# Patient Record
Sex: Female | Born: 1955 | Race: White | Hispanic: No | Marital: Married | Smoking: Never smoker
Health system: Southern US, Community
[De-identification: ages and names within clinical notes are randomized; demographics above are authoritative.]

## PROBLEM LIST (undated history)

## (undated) DIAGNOSIS — Z800 Family history of malignant neoplasm of digestive organs: Secondary | ICD-10-CM

## (undated) DIAGNOSIS — E785 Hyperlipidemia, unspecified: Secondary | ICD-10-CM

## (undated) DIAGNOSIS — I341 Nonrheumatic mitral (valve) prolapse: Secondary | ICD-10-CM

## (undated) DIAGNOSIS — I10 Essential (primary) hypertension: Secondary | ICD-10-CM

## (undated) DIAGNOSIS — K259 Gastric ulcer, unspecified as acute or chronic, without hemorrhage or perforation: Secondary | ICD-10-CM

## (undated) DIAGNOSIS — K219 Gastro-esophageal reflux disease without esophagitis: Secondary | ICD-10-CM

## (undated) DIAGNOSIS — M810 Age-related osteoporosis without current pathological fracture: Secondary | ICD-10-CM

## (undated) DIAGNOSIS — M8580 Other specified disorders of bone density and structure, unspecified site: Secondary | ICD-10-CM

## (undated) DIAGNOSIS — D649 Anemia, unspecified: Secondary | ICD-10-CM

## (undated) DIAGNOSIS — T7840XA Allergy, unspecified, initial encounter: Secondary | ICD-10-CM

## (undated) DIAGNOSIS — M1990 Unspecified osteoarthritis, unspecified site: Secondary | ICD-10-CM

## (undated) HISTORY — PX: KNEE ARTHROSCOPY: SHX127

## (undated) HISTORY — PX: UPPER GASTROINTESTINAL ENDOSCOPY: SHX188

## (undated) HISTORY — PX: TEMPOROMANDIBULAR JOINT SURGERY: SHX35

## (undated) HISTORY — DX: Essential (primary) hypertension: I10

## (undated) HISTORY — DX: Age-related osteoporosis without current pathological fracture: M81.0

## (undated) HISTORY — PX: CHOLECYSTECTOMY: SHX55

## (undated) HISTORY — PX: COLONOSCOPY: SHX174

## (undated) HISTORY — PX: TUBAL LIGATION: SHX77

## (undated) HISTORY — PX: GASTRIC BYPASS: SHX52

## (undated) HISTORY — DX: Other specified disorders of bone density and structure, unspecified site: M85.80

## (undated) HISTORY — DX: Nonrheumatic mitral (valve) prolapse: I34.1

## (undated) HISTORY — PX: SKIN CANCER EXCISION: SHX779

## (undated) HISTORY — DX: Gastric ulcer, unspecified as acute or chronic, without hemorrhage or perforation: K25.9

## (undated) HISTORY — DX: Gastro-esophageal reflux disease without esophagitis: K21.9

## (undated) HISTORY — PX: TONSILLECTOMY AND ADENOIDECTOMY: SUR1326

## (undated) HISTORY — PX: BREAST REDUCTION SURGERY: SHX8

## (undated) HISTORY — DX: Anemia, unspecified: D64.9

## (undated) HISTORY — DX: Family history of malignant neoplasm of digestive organs: Z80.0

## (undated) HISTORY — DX: Hyperlipidemia, unspecified: E78.5

## (undated) HISTORY — DX: Unspecified osteoarthritis, unspecified site: M19.90

## (undated) HISTORY — DX: Allergy, unspecified, initial encounter: T78.40XA

---

## 1984-05-18 HISTORY — PX: BREAST EXCISIONAL BIOPSY: SUR124

## 1999-05-19 HISTORY — PX: REDUCTION MAMMAPLASTY: SUR839

## 1999-06-02 ENCOUNTER — Encounter: Admission: RE | Admit: 1999-06-02 | Discharge: 1999-06-02 | Payer: Self-pay | Admitting: Family Medicine

## 1999-06-02 ENCOUNTER — Encounter: Payer: Self-pay | Admitting: Family Medicine

## 1999-06-12 ENCOUNTER — Encounter: Admission: RE | Admit: 1999-06-12 | Discharge: 1999-09-10 | Payer: Self-pay | Admitting: Family Medicine

## 1999-10-07 ENCOUNTER — Other Ambulatory Visit: Admission: RE | Admit: 1999-10-07 | Discharge: 1999-10-07 | Payer: Self-pay | Admitting: Specialist

## 1999-10-17 ENCOUNTER — Encounter: Payer: Self-pay | Admitting: Family Medicine

## 1999-10-17 ENCOUNTER — Encounter: Admission: RE | Admit: 1999-10-17 | Discharge: 1999-10-17 | Payer: Self-pay | Admitting: Family Medicine

## 2000-02-26 ENCOUNTER — Other Ambulatory Visit: Admission: RE | Admit: 2000-02-26 | Discharge: 2000-02-26 | Payer: Self-pay | Admitting: Plastic Surgery

## 2000-02-26 ENCOUNTER — Encounter (INDEPENDENT_AMBULATORY_CARE_PROVIDER_SITE_OTHER): Payer: Self-pay | Admitting: Specialist

## 2002-07-04 ENCOUNTER — Encounter: Payer: Self-pay | Admitting: Family Medicine

## 2002-07-04 ENCOUNTER — Encounter: Admission: RE | Admit: 2002-07-04 | Discharge: 2002-07-04 | Payer: Self-pay | Admitting: Family Medicine

## 2002-09-21 ENCOUNTER — Ambulatory Visit (HOSPITAL_COMMUNITY): Admission: RE | Admit: 2002-09-21 | Discharge: 2002-09-21 | Payer: Self-pay | Admitting: Gastroenterology

## 2003-09-11 ENCOUNTER — Encounter: Admission: RE | Admit: 2003-09-11 | Discharge: 2003-09-11 | Payer: Self-pay | Admitting: Family Medicine

## 2004-11-04 ENCOUNTER — Encounter: Admission: RE | Admit: 2004-11-04 | Discharge: 2004-11-04 | Payer: Self-pay | Admitting: Family Medicine

## 2005-11-19 ENCOUNTER — Encounter: Admission: RE | Admit: 2005-11-19 | Discharge: 2005-11-19 | Payer: Self-pay | Admitting: Family Medicine

## 2005-11-24 ENCOUNTER — Encounter: Admission: RE | Admit: 2005-11-24 | Discharge: 2005-11-24 | Payer: Self-pay | Admitting: Family Medicine

## 2006-06-07 ENCOUNTER — Ambulatory Visit (HOSPITAL_COMMUNITY): Admission: RE | Admit: 2006-06-07 | Discharge: 2006-06-07 | Payer: Self-pay | Admitting: *Deleted

## 2006-06-08 ENCOUNTER — Ambulatory Visit (HOSPITAL_COMMUNITY): Admission: RE | Admit: 2006-06-08 | Discharge: 2006-06-08 | Payer: Self-pay | Admitting: *Deleted

## 2006-06-11 ENCOUNTER — Encounter: Admission: RE | Admit: 2006-06-11 | Discharge: 2006-09-09 | Payer: Self-pay | Admitting: *Deleted

## 2006-12-09 ENCOUNTER — Ambulatory Visit (HOSPITAL_COMMUNITY): Admission: RE | Admit: 2006-12-09 | Discharge: 2006-12-09 | Payer: Self-pay | Admitting: *Deleted

## 2006-12-16 ENCOUNTER — Encounter: Admission: RE | Admit: 2006-12-16 | Discharge: 2006-12-16 | Payer: Self-pay | Admitting: Family Medicine

## 2007-03-29 ENCOUNTER — Inpatient Hospital Stay (HOSPITAL_COMMUNITY): Admission: RE | Admit: 2007-03-29 | Discharge: 2007-03-31 | Payer: Self-pay | Admitting: *Deleted

## 2007-03-30 ENCOUNTER — Ambulatory Visit: Payer: Self-pay | Admitting: Vascular Surgery

## 2007-03-30 ENCOUNTER — Encounter (INDEPENDENT_AMBULATORY_CARE_PROVIDER_SITE_OTHER): Payer: Self-pay | Admitting: *Deleted

## 2007-04-04 ENCOUNTER — Encounter: Admission: RE | Admit: 2007-04-04 | Discharge: 2007-07-03 | Payer: Self-pay | Admitting: *Deleted

## 2007-08-08 ENCOUNTER — Encounter: Admission: RE | Admit: 2007-08-08 | Discharge: 2007-08-08 | Payer: Self-pay | Admitting: *Deleted

## 2007-09-13 ENCOUNTER — Encounter: Admission: RE | Admit: 2007-09-13 | Discharge: 2007-09-13 | Payer: Self-pay | Admitting: Family Medicine

## 2007-09-20 ENCOUNTER — Ambulatory Visit (HOSPITAL_COMMUNITY): Admission: RE | Admit: 2007-09-20 | Discharge: 2007-09-20 | Payer: Self-pay | Admitting: Family Medicine

## 2007-10-31 ENCOUNTER — Encounter: Admission: RE | Admit: 2007-10-31 | Discharge: 2007-10-31 | Payer: Self-pay | Admitting: *Deleted

## 2008-01-04 ENCOUNTER — Encounter: Admission: RE | Admit: 2008-01-04 | Discharge: 2008-01-04 | Payer: Self-pay | Admitting: Family Medicine

## 2008-06-12 ENCOUNTER — Emergency Department (HOSPITAL_COMMUNITY): Admission: EM | Admit: 2008-06-12 | Discharge: 2008-06-12 | Payer: Self-pay | Admitting: Emergency Medicine

## 2008-07-23 ENCOUNTER — Encounter: Admission: RE | Admit: 2008-07-23 | Discharge: 2008-07-23 | Payer: Self-pay | Admitting: Gastroenterology

## 2008-08-14 ENCOUNTER — Ambulatory Visit (HOSPITAL_COMMUNITY): Admission: RE | Admit: 2008-08-14 | Discharge: 2008-08-14 | Payer: Self-pay | Admitting: *Deleted

## 2008-09-25 ENCOUNTER — Encounter: Admission: RE | Admit: 2008-09-25 | Discharge: 2008-09-25 | Payer: Self-pay | Admitting: Family Medicine

## 2009-04-02 ENCOUNTER — Encounter: Admission: RE | Admit: 2009-04-02 | Discharge: 2009-04-02 | Payer: Self-pay | Admitting: Internal Medicine

## 2009-10-24 ENCOUNTER — Encounter: Admission: RE | Admit: 2009-10-24 | Discharge: 2009-10-24 | Payer: Self-pay | Admitting: Family Medicine

## 2010-07-01 ENCOUNTER — Other Ambulatory Visit: Payer: Self-pay | Admitting: Internal Medicine

## 2010-07-01 DIAGNOSIS — D4410 Neoplasm of uncertain behavior of unspecified adrenal gland: Secondary | ICD-10-CM

## 2010-07-23 ENCOUNTER — Ambulatory Visit
Admission: RE | Admit: 2010-07-23 | Discharge: 2010-07-23 | Disposition: A | Discharge status: Home / Self Care | Payer: 59 | Source: Ambulatory Visit | Attending: Internal Medicine | Admitting: Internal Medicine

## 2010-07-23 DIAGNOSIS — D4410 Neoplasm of uncertain behavior of unspecified adrenal gland: Secondary | ICD-10-CM

## 2010-08-28 LAB — DIFFERENTIAL
Basophils Absolute: 0 10*3/uL (ref 0.0–0.1)
Basophils Relative: 0 % (ref 0–1)
Eosinophils Absolute: 0.1 10*3/uL (ref 0.0–0.7)
Eosinophils Relative: 1 % (ref 0–5)
Monocytes Relative: 6 % (ref 3–12)
Neutro Abs: 2.3 10*3/uL (ref 1.7–7.7)

## 2010-08-28 LAB — CBC
HCT: 35.4 % — ABNORMAL LOW (ref 36.0–46.0)
Hemoglobin: 11.9 g/dL — ABNORMAL LOW (ref 12.0–15.0)
Platelets: 164 10*3/uL (ref 150–400)

## 2010-09-01 LAB — CBC
HCT: 39.7 % (ref 36.0–46.0)
Hemoglobin: 13.3 g/dL (ref 12.0–15.0)
MCHC: 33.6 g/dL (ref 30.0–36.0)
MCV: 97.9 fL (ref 78.0–100.0)
WBC: 6.7 10*3/uL (ref 4.0–10.5)

## 2010-09-01 LAB — URINALYSIS, ROUTINE W REFLEX MICROSCOPIC
Bilirubin Urine: NEGATIVE
Glucose, UA: NEGATIVE mg/dL
Hgb urine dipstick: NEGATIVE
Nitrite: NEGATIVE
Protein, ur: NEGATIVE mg/dL
pH: 7.5 (ref 5.0–8.0)

## 2010-09-01 LAB — COMPREHENSIVE METABOLIC PANEL
ALT: 31 U/L (ref 0–35)
AST: 24 U/L (ref 0–37)
Alkaline Phosphatase: 60 U/L (ref 39–117)
GFR calc non Af Amer: >60 mL/min (ref >60)
Total Bilirubin: 0.9 mg/dL (ref 0.3–1.2)
Total Protein: 6 g/dL (ref 6.0–8.3)

## 2010-09-01 LAB — LIPASE, BLOOD: Lipase: 33 U/L (ref 11–59)

## 2010-09-01 LAB — DIFFERENTIAL
Basophils Relative: 0 % (ref 0–1)
Monocytes Relative: 5 % (ref 3–12)
Neutro Abs: 3.8 10*3/uL (ref 1.7–7.7)

## 2010-09-30 NOTE — Op Note (Signed)
Faith [name], Faith [name]               ACCOUNT NO.:  [id number]   MEDICAL RECORD NO.:  [id number]          PATIENT TYPE:  AMB   LOCATION:  DAY                          FACILITY:  [facility]   PHYSICIAN:  Alfonse Ras, MD   DATE OF BIRTH:  [birth year]   DATE OF PROCEDURE:  DATE OF DISCHARGE:                               OPERATIVE REPORT   PREOPERATIVE DIAGNOSIS:  Status post laparoscopic bypass with  intermittent crampy abdominal pain.   POSTOPERATIVE DIAGNOSIS:  Two small band adhesions, and no evidence of  internal hernia.   PROCEDURE:  Diagnostic laparoscopy.   ANESTHESIA:  General.   SURGEON:  Alfonse Ras, MD.   DESCRIPTION:  The patient was taken to the operating room and placed in  supine position.  After adequate general anesthesia was induced using  endotracheal tube, the abdomen was prepped and draped in normal sterile  fashion.  Using a supraumbilical small vertical incision, I dissected  down the fascia.  This was opened vertically.  No Vicryl pursestring  suture was placed around the defect.  Hassan trocar was placed, and  under direct vision two 5-mm trocars were placed.  The small bowel was  run from the terminal ileum up to the gastrojejunostomy.  There was no  evidence of internal hernia, mesenteric defect, or Peterson defect.  There were two small band adhesions which were lysed.  Adequate  hemostasis was ensured.  Pneumoperitoneum was released.  The trocar was  removed and closed with a O Vicryl pursestring suture.  Skin incisions  were closed with subcuticular 4-0 Monocryl.  Steri-Strips and sterile  dressings were applied.  The patient tolerated the procedure well and  went to PACU in good condition.      Alfonse Ras, MD  Electronically Signed     KRE/MEDQ  D:  08/14/2008  T:  08/14/2008  Job:  [id number]

## 2010-09-30 NOTE — Op Note (Signed)
Faith [name]               ACCOUNT NO.:  [id number]   MEDICAL RECORD NO.:  [id number]          PATIENT TYPE:  INP   LOCATION:  X001                         FACILITY:  [facility]   PHYSICIAN:  Alfonse Ras, MD   DATE OF BIRTH:  December 17, 1955   DATE OF PROCEDURE:  DATE OF DISCHARGE:                               OPERATIVE REPORT   PREOPERATIVE DIAGNOSIS:  Medically refractory morbid obesity.   POSTOPERATIVE DIAGNOSIS:  Medically refractory morbid obesity.   PROCEDURE:  Laparoscopic Roux-en-Y gastric bypass, antecolic,  antegastric, left facing Roux limb.   SURGEON:  Alfonse Ras, M.D.   ASSISTANT:  Thornton Park. Daphine Deutscher, M.D.   ANESTHESIA:  General.   DESCRIPTION:  The patient was taken to the operating room, placed in a  supine position.  After adequate general anesthesia was induced using  endotracheal tube, Foley catheter was placed and the abdomen was prepped  and draped in the normal sterile fashion.  Using an incision in the left  subcostal region, an Optiview trocar obtained peritoneal access under  direct vision.  Pneumoperitoneum was obtained.  Additional 12 mm trocars  were placed in the right upper quadrant and left infraumbilical region.  Additional 5 mm trocar was placed in the left abdomen under direct  vision.  The abdomen was inspected and found to have no abnormalities.  The ligament of Treitz was identified and counting down 40 cm distal,  the small bowel was transected using a white load GIA stapling device.  The distal end was tagged with a Penrose drain.  I then counted 100 cm  distal from the Penrose drain and performed a side-to-side  jejunojejunostomy from the biliary limb to the distal small bowel.  This  was accomplished with a 45 mm GIA stapling device after enterotomies  were created using a harmonic scalpel.  The defect was closed with  running 2-0 Vicryl suture and inspected.  Tisseel was placed over the  anastomosis.  Mesenteric defect was  closed with a running 2-0 silk  suture.   The patient was then put in steep head-up and all tubes were removed  from the mouth except the endotracheal tube.  The 5 mm trocar was placed  in the upper abdomen and Nathanson liver retractor was placed retract  the left lateral segment of the liver anteriorly.  The angle of His was  sharply and bluntly dissected.  An area on the lesser curve  approximately 4 cm from the EG junction was identified.  Using the  harmonic scalpel and blunt technique, the lesser sac was entered.  The  pouch was created with serial firings of the first gold load and then  blue load 60 mm GIA stapling device up to the angle of His.  The Maricela Curet  was put down through the EG junction to verify its presence.  The  Penrose drain was identified and the Roux limb was brought up along the  pouch.  The posterior layer was sewn with a running 2-0 Vicryl suture  along the staple line.  Tisseel was placed on the proximal part of the  pouch.  The remnant was oversewn with a running locking 2-0 silk suture.  A side-to-side gastrojejunostomy was performed after enterotomies were  created using a harmonic scalpel.  This was accomplished with a 45-mm  blue load stapling device.  The defect was closed with running 2-0  Vicryl sutures.  Additional stitches were placed in the anterior layer  where it appeared that there was a little gap along the suture line.  The endoscope was then inserted by Dr. Daphine Deutscher.  The Roux limb was  clamped and the anastomosis was insufflated with air and showed no  evidence of leak and patency of the anastomosis.  An anterior 2-0 Vicryl  suture was then placed through the serosa for the second layer.  Tisseel  was placed over the anastomosis.  Peterson's defect was closed with a  running 2-0 silk suture and __________  ties.  The upper abdomen was  irrigated.  Adequate hemostasis was ensured.  The patient was noted to  have a small hiatal hernia, which was  not repaired.  The  pneumoperitoneum was released.  Trocars were removed.  Skin was closed  with staples after being injected with Marcaine.  Sterile dressings were  applied.  The patient tolerated the procedure well and went to PACU in  good condition.      Alfonse Ras, MD  Electronically Signed     KRE/MEDQ  D:  03/29/2007  T:  03/29/2007  Job:  [id number]

## 2010-10-03 NOTE — Op Note (Signed)
   NAME:  Faith [name], Faith [name]                         ACCOUNT NO.:  [id number]   MEDICAL RECORD NO.:  [id number]                   PATIENT TYPE:  AMB   LOCATION:  ENDO                                 FACILITY:  [facility]   PHYSICIAN:  Danise Edge, M.D.                DATE OF BIRTH:  Jul 04, 1955   DATE OF PROCEDURE:  09/21/2002  DATE OF DISCHARGE:                                 OPERATIVE REPORT   PROCEDURE PERFORMED:  Screening colonoscopy.   ENDOSCOPIST:  Charolett Bumpers, M.D.   INDICATIONS FOR PROCEDURE:  The patient is [name] 55 year old female born  07/14/1955.  The patient's father was diagnosed with colon cancer at  age 44.   PREMEDICATION:  Versed 10 mg, Demerol 50 mg.   DESCRIPTION OF PROCEDURE:  After obtaining informed consent, the patient was  placed in the left lateral decubitus position.  I administered intravenous  Demerol and intravenous Versed to achieve conscious sedation for the  procedure.  The patient's blood pressure, oxygen saturations and cardiac  rhythm were monitored throughout the procedure and documented in the medical  record.   Anal inspection was normal.  Digital rectal exam was normal.  The pediatric  Olympus video colonoscope was introduced into the rectum and easily advanced  to the cecum.  Colonic preparation for the exam today was excellent.   Rectum:  Normal.   Sigmoid colon and descending colon:  Normal.   Splenic flexure:  Normal.   Transverse colon:  Normal.   Hepatic flexure:  Normal.   Ascending colon:  Normal.   Cecum and ileocecal valve:  Normal.    ASSESSMENT:  Normal screening proctocolonoscopy to the cecum.  No endoscopic  evidence for the presence of colorectal neoplasia.   RECOMMENDATIONS:  Repeat colonoscopy in five years.                                                 Danise Edge, M.D.    MJ/MEDQ  D:  09/21/2002  T:  09/21/2002  Job:  [id number]

## 2011-02-24 LAB — DIFFERENTIAL
Basophils Absolute: 0.1
Eosinophils Absolute: 0 — ABNORMAL LOW
Eosinophils Absolute: 0 — ABNORMAL LOW
Eosinophils Relative: 0
Eosinophils Relative: 0
Lymphocytes Relative: 15
Lymphocytes Relative: 37
Lymphs Abs: 0.8
Lymphs Abs: 1.9
Monocytes Absolute: 0.4
Monocytes Absolute: 0.5
Monocytes Relative: 3
Monocytes Relative: 5
Neutrophils Relative %: 57

## 2011-02-24 LAB — BASIC METABOLIC PANEL
CO2: 28
Chloride: 109
GFR calc Af Amer: >60
Glucose, Bld: 96
Sodium: 142

## 2011-02-24 LAB — CBC
HCT: 30.8 — ABNORMAL LOW
HCT: 33.3 — ABNORMAL LOW
HCT: 38.5
Hemoglobin: 10.8 — ABNORMAL LOW
Hemoglobin: 11.6 — ABNORMAL LOW
MCHC: 34.8
MCHC: 35
MCV: 93.6
MCV: 94.4
MCV: 94.6
Platelets: 231
RBC: 3.55 — ABNORMAL LOW
RBC: 4.08
RDW: 13.9
WBC: 14.2 — ABNORMAL HIGH
WBC: 5.2

## 2011-02-24 LAB — HEMOGLOBIN AND HEMATOCRIT, BLOOD: HCT: 35.5 — ABNORMAL LOW

## 2011-05-20 ENCOUNTER — Encounter: Payer: Self-pay | Admitting: Emergency Medicine

## 2011-05-20 ENCOUNTER — Emergency Department (HOSPITAL_COMMUNITY): Payer: Managed Care, Other (non HMO)

## 2011-05-20 ENCOUNTER — Emergency Department (HOSPITAL_COMMUNITY)
Admission: EM | Admit: 2011-05-20 | Discharge: 2011-05-21 | Disposition: A | Discharge status: Home / Self Care | Payer: Managed Care, Other (non HMO) | Attending: Emergency Medicine | Admitting: Emergency Medicine

## 2011-05-20 DIAGNOSIS — M25559 Pain in unspecified hip: Secondary | ICD-10-CM | POA: Insufficient documentation

## 2011-05-20 DIAGNOSIS — W010XXA Fall on same level from slipping, tripping and stumbling without subsequent striking against object, initial encounter: Secondary | ICD-10-CM | POA: Insufficient documentation

## 2011-05-20 DIAGNOSIS — S7001XA Contusion of right hip, initial encounter: Secondary | ICD-10-CM

## 2011-05-20 DIAGNOSIS — S7000XA Contusion of unspecified hip, initial encounter: Secondary | ICD-10-CM | POA: Insufficient documentation

## 2011-05-20 DIAGNOSIS — W19XXXA Unspecified fall, initial encounter: Secondary | ICD-10-CM

## 2011-05-20 NOTE — ED Notes (Signed)
PT. REPORTS FELL LAST CHRISTMAS EVE ,  SLIPPED AND FELL AT GARAGE FLOOR , PRESENTS WITH PROGRESSING RIGHT HIP PAIN ,  DENIES HEMATURIA OR VAGINAL DISCHARGE.  NO LOC.

## 2011-05-21 ENCOUNTER — Emergency Department (HOSPITAL_COMMUNITY): Payer: Managed Care, Other (non HMO)

## 2011-05-21 MED ORDER — ONDANSETRON 4 MG PO TBDP
8.0000 mg | ORAL_TABLET | Freq: Once | ORAL | Status: AC
  Administered 2011-05-21: 8 mg via ORAL
  Filled 2011-05-21: qty 2

## 2011-05-21 MED ORDER — HYDROCODONE-ACETAMINOPHEN 5-325 MG PO TABS: 2.0000 | ORAL_TABLET | ORAL | Status: AC | PRN

## 2011-05-21 MED ORDER — HYDROMORPHONE HCL PF 2 MG/ML IJ SOLN
2.0000 mg | Freq: Once | INTRAMUSCULAR | Status: AC
  Administered 2011-05-21: 2 mg via INTRAMUSCULAR
  Filled 2011-05-21: qty 1

## 2011-05-21 NOTE — ED Notes (Signed)
RETURNED FROM CT SCAN , NO PAIN AT THIS TIME , RESPIRATIONS UNLABORED .

## 2011-05-21 NOTE — ED Provider Notes (Signed)
Medical screening examination/treatment/procedure(s) were performed by non-physician practitioner and as supervising physician I was immediately available for consultation/collaboration.  Kinsey Cowsert, MD 05/21/11 0830 

## 2011-05-21 NOTE — ED Provider Notes (Signed)
History     CSN: [id number]  Arrival date & time 05/20/11  2135   First MD Initiated Contact with Patient 05/21/11 0017      Chief Complaint  Patient presents with  . Fall    (Consider location/radiation/quality/duration/timing/severity/associated sxs/prior treatment) Patient is a 56 y.o. female presenting with fall. The history is provided by the patient.  Fall The accident occurred more than 2 days ago. The fall occurred while walking. She fell from a height of 3 to 5 ft. She landed on a hard floor. The point of impact was the right hip. The pain is present in the right hip. The pain is at a severity of 8/10. The pain is severe. She was ambulatory at the scene. There was no entrapment after the fall. There was no drug use involved in the accident. There was no alcohol use involved in the accident. Pertinent negatives include no numbness and no tingling. The symptoms are aggravated by standing and use of the injured limb.  Patient reports she slipped on a wet floor and fell on her right hip 05/11/2011. Since that time she has had increasing pain to this area. States significant pain with ambulation.  History reviewed. No pertinent past medical history.  Past Surgical History  Procedure Date  . Gastric bypass   . Knee surgery     No family history on file.  History  Substance Use Topics  . Smoking status: Never Smoker   . Smokeless tobacco: Not on file  . Alcohol Use: No    OB History    Grav Para Term Preterm Abortions TAB SAB Ect Mult Living                  Review of Systems  Constitutional: Negative.   HENT: Negative.   Eyes: Negative.   Respiratory: Negative.   Cardiovascular: Negative.   Gastrointestinal: Negative.   Genitourinary: Negative.   Musculoskeletal: Negative.   Skin: Negative.   Neurological: Negative.  Negative for tingling and numbness.  Hematological: Negative.   Psychiatric/Behavioral: Negative.     Allergies  Review of patient's  allergies indicates no known allergies.  Home Medications   Current Outpatient Rx  Name Route Sig Dispense Refill  . CALCIUM PO Oral Take 1 tablet by mouth daily. Adult chewable calcium supplement     . FIBER PO CHEW Oral Chew 1 tablet by mouth daily. Adult chewable fiber supplement     . IBUPROFEN 200 MG PO TABS Oral Take 600 mg by mouth every 6 (six) hours as needed. For pain     . ADULT MULTIVITAMIN W/MINERALS CH Oral Take 3 tablets by mouth daily. Adult gummy multi-vitamins     . HYDROCODONE-ACETAMINOPHEN 5-325 MG PO TABS Oral Take 2 tablets by mouth every 4 (four) hours as needed for pain. Take 1 to 2 tabs PO q4h PRN pain  20 tablet 0    BP 162/87  Pulse 64  Temp(Src) 98.8 F (37.1 C) (Oral)  Resp 19  SpO2 100%  Physical Exam  Constitutional: She is oriented to person, place, and time. She appears well-developed and well-nourished.  HENT:  Head: Normocephalic and atraumatic.  Eyes: Conjunctivae are normal.  Neck: Neck supple.  Cardiovascular: Normal rate and regular rhythm.   Pulmonary/Chest: Effort normal and breath sounds normal.  Abdominal: Soft. Bowel sounds are normal.  Musculoskeletal: Normal range of motion.       Legs: Neurological: She is alert and oriented to person, place, and time.  Skin: Skin  is warm and dry. No erythema.  Psychiatric: She has a normal mood and affect.    ED Course  Procedures  right hip x-ray negative for acute fracture. Due to patient's level of pain, CT of the right hip.  Patient reports significant relief of pain with IM medication. CT findings discussed with patient and spouse. Will plan to discharge home with crutches to assist with ambulation, medication for pain, and encourage patient to follow up with Dr. Charlann Boxer on this Friday as scheduled. Patient agreeable with plan.    Labs Reviewed - No data to display Dg Hip Complete Right  05/20/2011  *RADIOLOGY REPORT*  Clinical Data: Right hip and buttock pain status post fall.  RIGHT HIP -  COMPLETE 2+ VIEW  Comparison: Pelvic CT 06/12/2008.  Findings: The mineralization and alignment are normal.  There is no evidence of acute fracture or dislocation.  There is no evidence of femoral head osteonecrosis.  The hip joint spaces are preserved. There are small pelvic phleboliths on the right which appear unchanged.  IMPRESSION: No acute osseous findings or significant arthropathic changes.  Original Report Authenticated By: Gerrianne Scale, M.D.   Ct Hip Right Wo Contrast  05/21/2011  *RADIOLOGY REPORT*  Clinical Data: Right hip pain after fall on 12/24  CT OF THE RIGHT HIP WITHOUT CONTRAST  Technique:  Multidetector CT imaging was performed according to the standard protocol. Multiplanar CT image reconstructions were also generated.  Comparison: Plain radiographs 05/20/2011  Findings: The right SI joint, visualized right pelvis, right acetabulum, and right hip appear intact.  No evidence of acute fracture or subluxation.  No focal bone lesion or bone destruction. Bone cortex and trabecular architecture are intact.  Mild hypertrophic degenerative changes in the hip joint.  No significant soft tissue hematoma or fluid collections.  IMPRESSION: Mild degenerative changes.  No acute fractures demonstrated in the right hip.  Original Report Authenticated By: Marlon Pel, M.D.     1. Contusion of right hip   2. Fall       MDM  Right hip contusion status post fall 10 days ago        Leanne Chang, NP 05/21/11 [phone]

## 2012-04-07 ENCOUNTER — Other Ambulatory Visit: Payer: Self-pay | Admitting: Family Medicine

## 2012-04-07 DIAGNOSIS — Z1231 Encounter for screening mammogram for malignant neoplasm of breast: Secondary | ICD-10-CM

## 2012-04-07 DIAGNOSIS — Z780 Asymptomatic menopausal state: Secondary | ICD-10-CM

## 2012-04-18 ENCOUNTER — Ambulatory Visit: Payer: Managed Care, Other (non HMO)

## 2012-04-18 ENCOUNTER — Ambulatory Visit: Payer: Managed Care, Other (non HMO) | Attending: Orthopaedic Surgery | Admitting: Physical Therapy

## 2012-04-18 DIAGNOSIS — M25569 Pain in unspecified knee: Secondary | ICD-10-CM | POA: Insufficient documentation

## 2012-04-18 DIAGNOSIS — IMO0001 Reserved for inherently not codable concepts without codable children: Secondary | ICD-10-CM | POA: Insufficient documentation

## 2012-04-18 DIAGNOSIS — M6281 Muscle weakness (generalized): Secondary | ICD-10-CM | POA: Insufficient documentation

## 2012-04-18 DIAGNOSIS — M25669 Stiffness of unspecified knee, not elsewhere classified: Secondary | ICD-10-CM | POA: Insufficient documentation

## 2012-04-21 ENCOUNTER — Encounter: Payer: 59 | Admitting: Physical Therapy

## 2012-04-26 ENCOUNTER — Ambulatory Visit: Payer: Managed Care, Other (non HMO) | Admitting: Physical Therapy

## 2012-05-03 ENCOUNTER — Ambulatory Visit: Payer: Managed Care, Other (non HMO) | Admitting: Physical Therapy

## 2012-05-05 ENCOUNTER — Encounter: Payer: 59 | Admitting: Physical Therapy

## 2012-05-20 ENCOUNTER — Ambulatory Visit
Admission: RE | Admit: 2012-05-20 | Discharge: 2012-05-20 | Disposition: A | Discharge status: Home / Self Care | Payer: Managed Care, Other (non HMO) | Source: Ambulatory Visit | Attending: Family Medicine | Admitting: Family Medicine

## 2012-05-20 DIAGNOSIS — Z1231 Encounter for screening mammogram for malignant neoplasm of breast: Secondary | ICD-10-CM

## 2012-05-20 DIAGNOSIS — Z780 Asymptomatic menopausal state: Secondary | ICD-10-CM

## 2012-06-01 ENCOUNTER — Ambulatory Visit (INDEPENDENT_AMBULATORY_CARE_PROVIDER_SITE_OTHER): Payer: Self-pay | Admitting: Surgery

## 2012-06-02 ENCOUNTER — Telehealth (INDEPENDENT_AMBULATORY_CARE_PROVIDER_SITE_OTHER): Payer: Self-pay

## 2012-06-02 NOTE — Telephone Encounter (Signed)
Spoke with pt in regards to MM canceling his office tomorrow.  Since she is a new bari, I offered his first available appt for that - Fri, Jan 31st @330p .  She accepted the appt.

## 2012-06-03 ENCOUNTER — Ambulatory Visit (INDEPENDENT_AMBULATORY_CARE_PROVIDER_SITE_OTHER): Payer: Self-pay | Admitting: Surgery

## 2012-06-17 ENCOUNTER — Encounter (INDEPENDENT_AMBULATORY_CARE_PROVIDER_SITE_OTHER): Payer: Self-pay | Admitting: Surgery

## 2012-06-17 ENCOUNTER — Ambulatory Visit (INDEPENDENT_AMBULATORY_CARE_PROVIDER_SITE_OTHER): Payer: Commercial Indemnity | Admitting: Surgery

## 2012-06-17 VITALS — BP 121/77 | HR 76 | Temp 97.8°F | Resp 18 | Ht 63.0 in | Wt 167.4 lb

## 2012-06-17 DIAGNOSIS — Z9884 Bariatric surgery status: Secondary | ICD-10-CM | POA: Insufficient documentation

## 2012-06-17 DIAGNOSIS — Z9049 Acquired absence of other specified parts of digestive tract: Secondary | ICD-10-CM

## 2012-06-17 NOTE — Progress Notes (Signed)
Faith [name] 57 y.o.  Body mass index is 29.65 kg/(m^2).  There is no problem list on file for this patient.   No Known Allergies  Past Surgical History  Procedure Date  . Gastric bypass   . Knee surgery   . Cholecystectomy    No primary provider on file. No diagnosis found.  Faith [name] had roux Y gastric bypass in 2008 by Dr. Colin Benton and a laparoscopy for 2 adhesions but no internal hernia in 07/2008.  Over the last month she's been having some intermittent right-sided abdominal pain about the level of the umbilicus but it is not associated with nausea vomiting. She has had her gallbladder removed previously. This is a new pain. I want to go ahead and get a CT scan to look for any evidence of a possible partial internal herniation or ventral hernia. I do not palpate anything in the intra-abdominal wall at indicates a ventral hernia is present.  Plan: CT abdomen and pelvis with followup discussion and evaluation. Matt B. Daphine Deutscher, MD, [facility] [phone] beeper [phone]  06/17/2012 4:58 PM

## 2012-06-21 ENCOUNTER — Ambulatory Visit
Admission: RE | Admit: 2012-06-21 | Discharge: 2012-06-21 | Disposition: A | Discharge status: Home / Self Care | Payer: Managed Care, Other (non HMO) | Source: Ambulatory Visit | Attending: Surgery | Admitting: Surgery

## 2012-06-21 DIAGNOSIS — Z9884 Bariatric surgery status: Secondary | ICD-10-CM

## 2012-06-21 MED ORDER — IOHEXOL 300 MG/ML  SOLN
100.0000 mL | Freq: Once | INTRAMUSCULAR | Status: AC | PRN
  Administered 2012-06-21: 100 mL via INTRAVENOUS

## 2012-06-24 ENCOUNTER — Telehealth (INDEPENDENT_AMBULATORY_CARE_PROVIDER_SITE_OTHER): Payer: Self-pay

## 2012-06-24 NOTE — Telephone Encounter (Signed)
The patient called for her CT results.  She is available until 2:30 at [phone].

## 2012-06-24 NOTE — Telephone Encounter (Signed)
Pt called for CT results; gave her the result as negative for any findings.  She would like to know what is Dr. Ermalene Searing next step to find out the source of her pain.  Please advise.  (She is not scheduled for a follow-up appt with Dr. Daphine Deutscher to discuss.)

## 2012-06-24 NOTE — Telephone Encounter (Signed)
LMOM; returned pt's call regarding her CT results.  I did not see in her chart that I could leave results on her VM, so I asked that she return call the office.  I also called the triage office to let them know that it is OK to give this pt her results over the phone.

## 2012-08-16 ENCOUNTER — Telehealth (INDEPENDENT_AMBULATORY_CARE_PROVIDER_SITE_OTHER): Payer: Self-pay

## 2012-08-16 NOTE — Telephone Encounter (Signed)
LMOM letting pt know that I have scheduled her to see MM to discuss her CT finding for Thurs May 8 @ 845a

## 2012-09-22 ENCOUNTER — Ambulatory Visit (INDEPENDENT_AMBULATORY_CARE_PROVIDER_SITE_OTHER): Payer: Commercial Indemnity | Admitting: Surgery

## 2012-12-05 ENCOUNTER — Other Ambulatory Visit: Payer: Self-pay | Admitting: Family Medicine

## 2012-12-05 DIAGNOSIS — Z139 Encounter for screening, unspecified: Secondary | ICD-10-CM

## 2012-12-12 ENCOUNTER — Ambulatory Visit
Admission: RE | Admit: 2012-12-12 | Discharge: 2012-12-12 | Disposition: A | Discharge status: Home / Self Care | Payer: Managed Care, Other (non HMO) | Source: Ambulatory Visit | Attending: Family Medicine | Admitting: Family Medicine

## 2012-12-12 DIAGNOSIS — Z139 Encounter for screening, unspecified: Secondary | ICD-10-CM

## 2013-07-11 ENCOUNTER — Other Ambulatory Visit: Payer: Self-pay

## 2013-07-11 DIAGNOSIS — Z1231 Encounter for screening mammogram for malignant neoplasm of breast: Secondary | ICD-10-CM

## 2013-07-25 ENCOUNTER — Ambulatory Visit
Admission: RE | Admit: 2013-07-25 | Discharge: 2013-07-25 | Disposition: A | Discharge status: Home / Self Care | Payer: BC Managed Care – PPO | Source: Ambulatory Visit

## 2013-07-25 DIAGNOSIS — Z1231 Encounter for screening mammogram for malignant neoplasm of breast: Secondary | ICD-10-CM

## 2014-07-05 ENCOUNTER — Other Ambulatory Visit: Payer: Self-pay | Admitting: Family Medicine

## 2014-07-05 DIAGNOSIS — Z1231 Encounter for screening mammogram for malignant neoplasm of breast: Secondary | ICD-10-CM

## 2014-07-05 DIAGNOSIS — M810 Age-related osteoporosis without current pathological fracture: Secondary | ICD-10-CM

## 2014-07-30 ENCOUNTER — Ambulatory Visit: Payer: Self-pay

## 2014-07-30 ENCOUNTER — Other Ambulatory Visit: Payer: Self-pay

## 2014-08-02 ENCOUNTER — Ambulatory Visit
Admission: RE | Admit: 2014-08-02 | Discharge: 2014-08-02 | Disposition: A | Discharge status: Home / Self Care | Payer: Commercial Indemnity | Source: Ambulatory Visit | Attending: Family Medicine | Admitting: Family Medicine

## 2014-08-02 DIAGNOSIS — M810 Age-related osteoporosis without current pathological fracture: Secondary | ICD-10-CM

## 2014-08-02 DIAGNOSIS — Z1231 Encounter for screening mammogram for malignant neoplasm of breast: Secondary | ICD-10-CM

## 2014-08-22 ENCOUNTER — Ambulatory Visit (INDEPENDENT_AMBULATORY_CARE_PROVIDER_SITE_OTHER): Payer: Managed Care, Other (non HMO) | Admitting: Neurology

## 2014-08-22 ENCOUNTER — Encounter: Payer: Self-pay | Admitting: Neurology

## 2014-08-22 VITALS — BP 112/78 | HR 61 | Resp 16 | Ht 63.0 in | Wt 164.0 lb

## 2014-08-22 DIAGNOSIS — I159 Secondary hypertension, unspecified: Secondary | ICD-10-CM | POA: Diagnosis not present

## 2014-08-22 DIAGNOSIS — R292 Abnormal reflex: Secondary | ICD-10-CM | POA: Insufficient documentation

## 2014-08-22 DIAGNOSIS — R413 Other amnesia: Secondary | ICD-10-CM | POA: Diagnosis not present

## 2014-08-22 NOTE — Progress Notes (Signed)
NEUROLOGY CONSULTATION NOTE  Faith [name] MRN: [id number] DOB: 02-16-1956  Referring provider: Bing Matter, PA-C Primary care provider: Bing Matter, PA-C  Reason for consult:  Memory loss  Thank you for your kind referral of [facility] for consultation of the above symptoms. Although her history is well known to you, please allow me to reiterate it for the purpose of our medical record. Records and images were personally reviewed where available.  HISTORY OF PRESENT ILLNESS: This is a pleasant 59 year old right-handed woman with history of diet-controlled hypertension, vertigo, presenting for evaluation of worsening short-term memory. She started noticing symptoms for the past few years, but this has been getting worse where she frequently misplaces things. She forgets her phone and has had to go home several times to get it. She forgets names, small details that has affected her work a little. She has trouble focusing on paperwork. She occasionally forgets a word she would normally know. Her husband and sons have told her that she repeats herself. She has misplaced bills and missed bill payments and has had to call credit card companies. She has burned food in the oven forgetting about it. She has noticed more difficulties multitasking. She denies getting lost driving, but has found herself going somewhere she did not mean to go, she can easily re-orient herself. She denies any difficulties with ADLs. She denies any head injuries, rare alcohol intake. She is adopted and does not know her family history.  She denies any headaches, dizziness, diplopia, dysarthria, dysphagia, back pain, focal numbness/tingling/weakness, bowel/bladder dysfunction. No tremor or anosmia. She denies any staring/unresponsive episodes. She has had occasional neck pain occurring around once a month. She has had a few bouts of positional vertigo.   Laboratory Data: 07/03/14: CBC, CMP normal. TSH 2.50  PAST  MEDICAL HISTORY: Past Medical History  Diagnosis Date  . Hypertension   . Arthritis   . Osteoporosis     PAST SURGICAL HISTORY: Past Surgical History  Procedure Laterality Date  . Gastric bypass    . Knee arthroscopy      right  . Cholecystectomy    . Breast reduction surgery    . Tonsillectomy and adenoidectomy    . Tubal ligation    . Temporomandibular joint surgery      MEDICATIONS: Current Outpatient Prescriptions on File Prior to Visit  Medication Sig Dispense Refill  . CALCIUM PO Take 1 tablet by mouth daily. Adult chewable calcium supplement     . ibandronate (BONIVA) 150 MG tablet Take 150 mg by mouth every 30 (thirty) days. Take in the morning with a full glass of water, on an empty stomach, and do not take anything else by mouth or lie down for the next 30 min.    Marland Kitchen ibuprofen (ADVIL,MOTRIN) 200 MG tablet Take 600 mg by mouth every 6 (six) hours as needed. For pain     . Multiple Vitamin (MULITIVITAMIN WITH MINERALS) TABS Take 3 tablets by mouth daily. Adult gummy multi-vitamins      No current facility-administered medications on file prior to visit.    ALLERGIES: No Known Allergies  FAMILY HISTORY: Family History  Problem Relation Age of Onset  . Adopted: Yes    SOCIAL HISTORY: History   Social History  . Marital Status: Married    Spouse Name: N/A  . Number of Children: 2  . Years of Education: N/A   Occupational History  . Mental Health Therapist    Social History Main Topics  .  Smoking status: Never Smoker   . Smokeless tobacco: Not on file  . Alcohol Use: 0.0 oz/week    0 Standard drinks or equivalent per week     Comment: rare  . Drug Use: No  . Sexual Activity: Not on file   Other Topics Concern  . Not on file   Social History Narrative    REVIEW OF SYSTEMS: Constitutional: No fevers, chills, or sweats, no generalized fatigue, change in appetite Eyes: No visual changes, double vision, eye pain Ear, nose and throat: No hearing  loss, ear pain, nasal congestion, sore throat Cardiovascular: No chest pain, palpitations Respiratory:  No shortness of breath at rest or with exertion, wheezes GastrointestinaI: No nausea, vomiting, diarrhea, abdominal pain, fecal incontinence Genitourinary:  No dysuria, urinary retention or frequency Musculoskeletal:  + neck pain,no back pain Integumentary: No rash, pruritus, skin lesions Neurological: as above Psychiatric: No depression, insomnia, anxiety Endocrine: No palpitations, fatigue, diaphoresis, mood swings, change in appetite, change in weight, increased thirst Hematologic/Lymphatic:  No anemia, purpura, petechiae. Allergic/Immunologic: no itchy/runny eyes, nasal congestion, recent allergic reactions, rashes  PHYSICAL EXAM: Filed Vitals:   08/22/14 1050  BP: 112/78  Pulse: 61  Resp: 16   General: No acute distress Head:  Normocephalic/atraumatic Eyes: Fundoscopic exam shows bilateral sharp discs, no vessel changes, exudates, or hemorrhages Neck: supple, no paraspinal tenderness, full range of motion Back: No paraspinal tenderness Heart: regular rate and rhythm Lungs: Clear to auscultation bilaterally. Vascular: No carotid bruits. Skin/Extremities: No rash, no edema Neurological Exam: Mental status: alert and oriented to person, place, and time, no dysarthria or aphasia, Fund of knowledge is appropriate.  Recent and remote memory are intact.  Attention and concentration are normal.    Able to name objects and repeat phrases.  Montreal Cognitive Assessment  08/22/2014  Visuospatial/ Executive (0/5) 5  Naming (0/3) 3  Attention: Read list of digits (0/2) 2  Attention: Read list of letters (0/1) 1  Attention: Serial 7 subtraction starting at 100 (0/3) 3  Language: Repeat phrase (0/2) 2  Language : Fluency (0/1) 1  Abstraction (0/2) 2  Delayed Recall (0/5) 5  Orientation (0/6) 6  Total 30  Adjusted Score (based on education) 30   Cranial nerves: CN I: not  tested CN II: pupils equal, round and reactive to light, visual fields intact, fundi unremarkable. CN III, IV, VI:  full range of motion, no nystagmus, no ptosis CN V: facial sensation intact CN VII: upper and lower face symmetric CN VIII: hearing intact to finger rub CN IX, X: gag intact, uvula midline CN XI: sternocleidomastoid and trapezius muscles intact CN XII: tongue midline Bulk & Tone: normal, no cogwheeling, no fasciculations. Motor: 5/5 throughout with no pronator drift. Sensation: intact to light touch, cold, pin, vibration and joint position sense.  No extinction to double simultaneous stimulation.  Romberg test negative Deep Tendon Reflexes: brisk +3 on both UE, +2 on both LE, no ankle clonus, +Hoffman sign on the right Plantar responses: downgoing bilaterally Cerebellar: no incoordination on finger to nose, heel to shin. No dysdiadochokinesia Gait: narrow-based and steady, able to tandem walk adequately. Tremor: none  IMPRESSION: This is a pleasant 59 year old right-handed woman presenting with worsening short-term memory loss. Her neurological exam shows brisk reflexes with +Hoffman sign on the right, otherwise no other focal findings. Her MOCA score today is normal 30/30. We discussed different causes of memory loss. Check B12 level. MRI brain with and without contrast will be ordered to assess for  underlying structural abnormality. We discussed pseudodementia and memory changes due to mood and inattention, focusing problems. She may benefit from Neuropsychological evaluation in the future. We discussed the importance of physical exercise, and brain stimulation exercises for brain health. She will follow-up in 6 months.   Thank you for allowing me to participate in the care of this patient. Please do not hesitate to call for any questions or concerns.   Ellouise Newer, M.D.  CC: Bing Matter, PA-C

## 2014-08-22 NOTE — Patient Instructions (Addendum)
1. Schedule MRI brain with and without contrast. We have scheduled you at [facility] for your MRI on 09/12/2014 at 8:00 am. Please arrive 15 minutes prior and go to 1st floor radiology. If you need to reschedule for any reason please call [phone]. 2. Bloodwork for B12 level 3. Physical exercise and brain stimulation exercises are important for brain health 4. Follow-up in 6 months

## 2014-08-23 LAB — CREATININE, SERUM: Creat: 0.56 mg/dL (ref 0.50–1.10)

## 2014-08-23 LAB — VITAMIN B12: VITAMIN B 12: 659 pg/mL (ref 211–911)

## 2014-08-24 ENCOUNTER — Telehealth: Payer: Self-pay | Admitting: Family Medicine

## 2014-08-24 NOTE — Telephone Encounter (Signed)
-----   Message from Cameron Sprang, MD sent at 08/24/2014  8:09 AM EDT ----- Pls let her know B12 level is normal, thanks

## 2014-08-24 NOTE — Telephone Encounter (Signed)
Patient was notified of result. 

## 2014-09-12 ENCOUNTER — Ambulatory Visit (HOSPITAL_COMMUNITY)
Admission: RE | Admit: 2014-09-12 | Discharge: 2014-09-12 | Disposition: A | Discharge status: Home / Self Care | Payer: Managed Care, Other (non HMO) | Source: Ambulatory Visit | Attending: Neurology | Admitting: Neurology

## 2014-09-12 DIAGNOSIS — R413 Other amnesia: Secondary | ICD-10-CM | POA: Diagnosis present

## 2014-09-12 DIAGNOSIS — R292 Abnormal reflex: Secondary | ICD-10-CM | POA: Insufficient documentation

## 2014-09-12 MED ORDER — GADOBENATE DIMEGLUMINE 529 MG/ML IV SOLN
15.0000 mL | Freq: Once | INTRAVENOUS | Status: AC
  Administered 2014-09-12: 14 mL via INTRAVENOUS

## 2014-09-13 ENCOUNTER — Telehealth: Payer: Self-pay | Admitting: Family Medicine

## 2014-09-13 NOTE — Telephone Encounter (Signed)
Lmovm to return my call. 

## 2014-09-13 NOTE — Telephone Encounter (Signed)
-----   Message from Cameron Sprang, MD sent at 09/12/2014  3:14 PM EDT ----- Please let patient know I reviewed MRI brain and it is normal, no evidence of tumor, stroke, or bleed. thanks

## 2014-09-17 NOTE — Telephone Encounter (Signed)
Patient returned my call. Notified her of results.

## 2014-09-17 NOTE — Telephone Encounter (Signed)
Lmovm to return my call. 

## 2015-02-21 ENCOUNTER — Ambulatory Visit: Payer: Commercial Indemnity | Admitting: Neurology

## 2015-03-26 ENCOUNTER — Telehealth: Payer: Self-pay | Admitting: Neurology

## 2015-03-26 NOTE — Telephone Encounter (Signed)
Abby from [facility]/ called to check if pt has an updated Auth for MRI/call back @ [phone]

## 2015-03-27 NOTE — Telephone Encounter (Signed)
Tried calling number back several times, always a busy signal.

## 2016-01-07 ENCOUNTER — Other Ambulatory Visit: Payer: Self-pay | Admitting: Family Medicine

## 2016-01-14 ENCOUNTER — Ambulatory Visit (INDEPENDENT_AMBULATORY_CARE_PROVIDER_SITE_OTHER): Payer: Managed Care, Other (non HMO) | Admitting: Sports Medicine

## 2016-01-14 ENCOUNTER — Ambulatory Visit
Admission: RE | Admit: 2016-01-14 | Discharge: 2016-01-14 | Disposition: A | Discharge status: Home / Self Care | Payer: Managed Care, Other (non HMO) | Source: Ambulatory Visit | Attending: Sports Medicine | Admitting: Sports Medicine

## 2016-01-14 ENCOUNTER — Encounter: Payer: Self-pay | Admitting: Sports Medicine

## 2016-01-14 VITALS — BP 158/88 | Ht 63.0 in | Wt 174.0 lb

## 2016-01-14 DIAGNOSIS — M129 Arthropathy, unspecified: Secondary | ICD-10-CM | POA: Diagnosis not present

## 2016-01-14 DIAGNOSIS — M1731 Unilateral post-traumatic osteoarthritis, right knee: Secondary | ICD-10-CM | POA: Diagnosis not present

## 2016-01-14 DIAGNOSIS — M1711 Unilateral primary osteoarthritis, right knee: Secondary | ICD-10-CM

## 2016-01-14 MED ORDER — IBANDRONATE SODIUM 150 MG PO TABS: 150.0000 mg | ORAL_TABLET | ORAL | 0 refills | Status: DC

## 2016-01-14 NOTE — Patient Instructions (Signed)
  Perform lateral leg lifts, 30 sets 3 x a day  Walk 4 days a week Bike 3 days a week, starting at 24min then progressing at 5 min intervals  Follow up in 4 weeks

## 2016-01-14 NOTE — Progress Notes (Signed)
   Subjective:    Patient ID: Faith [name], female    DOB: 1955/08/02, 60 y.o.   MRN: [id number]  Ms. Tabet is a 60 year old Caucasian female, who presents to office today with chief complaint of right knee pain. She reports that symptoms have been present for at least 2 months. Does not report of any inciting incident or trauma.    Hx of athroscipy on RT knee from Dr Theda Sers after injury in [place] led to knee giving way This was difficult recovery Walks almost daily Now walking becoming difficult 2/2 pain  Past Hx : obesity surgery in 2014 Told she had some OA in 2012 during surg   Review of Systems RT knee sometimes swells No locking or giving way Left knee does not swell    Objective:   Physical Exam  Pleasant F/ NAD BP (!) 158/88   Ht 5\' 3"  (1.6 m)   Wt 174 lb (78.9 kg)   BMI 30.82 kg/m   Knee: RT  inspection with no erythema or effusion Puffiness of superficial bursa above patella  No obvious bony abnormalities. Palpationno warmth Mild medial joint line tenderness  No patellar tenderness or condyle tenderness. ROM normal in flexion and extension and lower leg rotation. However she has 20 deg less flexion (130) than on left knee Ligaments with solid consistent endpoints including ACL, PCL, LCL, MCL. Negative Mcmurray's and provocative meniscal tests. Non painful patellar compression. Patellar and quadriceps tendons unremarkable. Hamstring and quadriceps strength is normal. Hip abduction strong  [place] RT knee No significant SPP effusion QT and PT normal Medial meniscus bulging Localized swelling and loss of some meniscus at ML of med joint line Lat meniscus normal  XRay Reviewed and shows mild OA only with limited loss of joint space       Assessment & Plan:  1. Probable meniscal contusion  Start with SLR type exercises Biking if she has access  2.  Mild OA  She is good candidate for research protocol on HEP

## 2016-01-14 NOTE — Assessment & Plan Note (Signed)
We will use HEP Refer to PT study to see if she is good candidate OTC NSAIDs only for pain

## 2016-03-31 ENCOUNTER — Other Ambulatory Visit: Payer: Self-pay | Admitting: Physician Assistant

## 2016-03-31 DIAGNOSIS — R1013 Epigastric pain: Secondary | ICD-10-CM

## 2016-03-31 DIAGNOSIS — Z9884 Bariatric surgery status: Secondary | ICD-10-CM

## 2016-04-01 ENCOUNTER — Ambulatory Visit
Admission: RE | Admit: 2016-04-01 | Discharge: 2016-04-01 | Disposition: A | Discharge status: Home / Self Care | Payer: Managed Care, Other (non HMO) | Source: Ambulatory Visit | Attending: Physician Assistant | Admitting: Physician Assistant

## 2016-04-01 DIAGNOSIS — R1013 Epigastric pain: Secondary | ICD-10-CM

## 2016-04-01 DIAGNOSIS — Z9884 Bariatric surgery status: Secondary | ICD-10-CM

## 2016-04-01 MED ORDER — IOPAMIDOL (ISOVUE-300) INJECTION 61%
100.0000 mL | Freq: Once | INTRAVENOUS | Status: AC | PRN
  Administered 2016-04-01: 100 mL via INTRAVENOUS

## 2016-04-16 ENCOUNTER — Encounter: Payer: Self-pay | Admitting: Gastroenterology

## 2016-06-09 ENCOUNTER — Other Ambulatory Visit: Payer: Self-pay | Admitting: Family Medicine

## 2016-06-09 DIAGNOSIS — M810 Age-related osteoporosis without current pathological fracture: Secondary | ICD-10-CM

## 2016-06-09 DIAGNOSIS — Z1231 Encounter for screening mammogram for malignant neoplasm of breast: Secondary | ICD-10-CM

## 2016-06-10 ENCOUNTER — Ambulatory Visit: Payer: Managed Care, Other (non HMO) | Admitting: Gastroenterology

## 2016-08-03 ENCOUNTER — Ambulatory Visit
Admission: RE | Admit: 2016-08-03 | Discharge: 2016-08-03 | Disposition: A | Discharge status: Home / Self Care | Payer: Managed Care, Other (non HMO) | Source: Ambulatory Visit | Attending: Family Medicine | Admitting: Family Medicine

## 2016-08-03 DIAGNOSIS — M810 Age-related osteoporosis without current pathological fracture: Secondary | ICD-10-CM

## 2016-08-03 DIAGNOSIS — Z1231 Encounter for screening mammogram for malignant neoplasm of breast: Secondary | ICD-10-CM

## 2017-01-25 ENCOUNTER — Encounter: Payer: Self-pay | Admitting: Nurse Practitioner

## 2017-01-28 ENCOUNTER — Ambulatory Visit (INDEPENDENT_AMBULATORY_CARE_PROVIDER_SITE_OTHER): Payer: Managed Care, Other (non HMO) | Admitting: Nurse Practitioner

## 2017-01-28 ENCOUNTER — Encounter: Payer: Self-pay | Admitting: Nurse Practitioner

## 2017-01-28 VITALS — BP 132/80 | HR 64 | Ht 63.0 in | Wt 181.0 lb

## 2017-01-28 DIAGNOSIS — R933 Abnormal findings on diagnostic imaging of other parts of digestive tract: Secondary | ICD-10-CM | POA: Diagnosis not present

## 2017-01-28 DIAGNOSIS — Z800 Family history of malignant neoplasm of digestive organs: Secondary | ICD-10-CM | POA: Diagnosis not present

## 2017-01-28 DIAGNOSIS — K5900 Constipation, unspecified: Secondary | ICD-10-CM

## 2017-01-28 DIAGNOSIS — R1010 Upper abdominal pain, unspecified: Secondary | ICD-10-CM | POA: Diagnosis not present

## 2017-01-28 DIAGNOSIS — R109 Unspecified abdominal pain: Secondary | ICD-10-CM

## 2017-01-28 MED ORDER — OMEPRAZOLE 40 MG PO CPDR: 40.0000 mg | DELAYED_RELEASE_CAPSULE | ORAL | 3 refills | Status: DC

## 2017-01-28 NOTE — Patient Instructions (Signed)
If you are age 60 or older, your body mass index should be between 23-30. Your Body mass index is 32.06 kg/m. If this is out of the aforementioned range listed, please consider follow up with your Primary Care Provider.  If you are age 108 or younger, your body mass index should be between 19-25. Your Body mass index is 32.06 kg/m. If this is out of the aformentioned range listed, please consider follow up with your Primary Care Provider.   You have been scheduled for an endoscopy. Please follow written instructions given to you at your visit today. If you use inhalers (even only as needed), please bring them with you on the day of your procedure. Your physician has requested that you go to www.startemmi.com and enter the access code given to you at your visit today. This web site gives a general overview about your procedure. However, you should still follow specific instructions given to you by our office regarding your preparation for the procedure.  We have sent the following medications to your pharmacy for you to pick up at your convenience: Omeprazole 40 mg  STOP Pepcid.  Will call with MRI appointment and colonoscopy appointment.  Thank you for choosing me and [facility].   Tye Savoy, NP

## 2017-01-28 NOTE — Progress Notes (Addendum)
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 She is new to the practice, here for evaluation of abdominal pain. She has chronic but sporadic upper abdominal pain. Episodes occur one to 2 times a year. The last episode was November 2017 She underwent  A CT scan which revealed inflammation around the small bowel at the gastroenteric anastomosis. Lymph nodes in the area were enlarged up to 1 cm. No further workup done,   Several days ago patient developed a reoccurrence of the same exact pain. The pain radiated into her back.  She was seen in ED at [facility] were unremarkable.. Repeat CT scan of abd / pelvis revealed subtle irregularity along the anterior gastric bypass anastomosis with stranding. No NSAID use. 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 She was given Bentyl and Pepcid and referred to GI.  Patient had a virtual colonoscopy with [facility] in 2014.  PCP referred patient there for evaluation of abdominal pain but the wait was going to be too long. No rectal bleeding or melena. Stools range from soft to loose though lately she has been constipated.    Past Medical History:  Diagnosis Date  . Arthritis   . Family history of colon cancer in father   . Hypertension   . Osteoporosis      Past Surgical History:  Procedure Laterality Date  . BREAST EXCISIONAL BIOPSY Right 1986  . BREAST REDUCTION SURGERY    . CHOLECYSTECTOMY    . GASTRIC BYPASS    . KNEE ARTHROSCOPY     right  . REDUCTION MAMMAPLASTY Bilateral 2001  . TEMPOROMANDIBULAR JOINT SURGERY    . TONSILLECTOMY AND ADENOIDECTOMY    . TUBAL LIGATION     Family History  Problem Relation Age of Onset  . Adopted: Yes  . Colon cancer Father 41   Social History  Substance Use Topics  . Smoking status: Never Smoker  . Smokeless tobacco: Never Used  . Alcohol use 0.0 oz/week     Comment: rare    Current Outpatient Prescriptions  Medication Sig Dispense Refill  . CALCIUM PO Take 1 tablet by mouth daily. Adult chewable calcium supplement     . ibandronate (BONIVA) 150 MG tablet Take 1 tablet (150 mg total) by mouth every 30 (thirty) days. Take in the morning with a full glass of water, on an empty stomach, and do not take anything else by mouth or lie down for the next 30 min. 12 tablet 0  . ibuprofen (ADVIL,MOTRIN) 200 MG tablet Take 600 mg by mouth every 6 (six) hours as needed. For pain     . Multiple Vitamin (MULITIVITAMIN WITH MINERALS) TABS Take 3 tablets by mouth daily. Adult gummy multi-vitamins      No current facility-administered medications for this visit.    No Known Allergies   Review of Systems: All systems reviewed and negative except where noted in HPI.    Physical Exam: BP 132/80   Pulse 64   Ht 5\' 3"  (1.6 m)   Wt 181 lb (82.1 kg)   BMI 32.06 kg/m  Constitutional:  Well-developed, white female  in no acute distress. Psychiatric: Normal mood and affect. Behavior is normal. EENT: Pupils normal.  Conjunctivae are normal. No scleral icterus. Neck supple.  Cardiovascular: Normal rate, regular rhythm. No edema Pulmonary/chest: Effort normal and breath sounds normal. No wheezing,  rales or rhonchi. Abdominal: Soft, nondistended. Nontender. Bowel sounds active throughout. There are no masses palpable. No hepatomegaly. Lymphadenopathy: No cervical adenopathy noted. Neurological: Alert and oriented to person place and time. Skin: Skin is warm and dry. No rashes noted.   ASSESSMENT AND PLAN:   54. 61 yo female who is s/p remote gastric bypass approximately 10 years ago. She has chronic intermittent upper abdominal pain.  CT scan November  2017, during one of the episodes, revealed  fat inflammation around the small bowel at the gastroenteric anastomosis. Lymph nodes in the distribution were enlarged. No further workup was done. Now with recurrent upper abdominal  pain. Repeat CT scan in ED reveals a subtle irregularity along the anterior gastric bypass with associated stranding.   -for further evaluation of symptoms and CT scan findings patient will be scheduled for EGD. She would like to be under the care of Dr. Silverio Decamp. -stop pepcid, begin omeprazole prior to breakfast.   2. Constipation, new.  -This may be secondary to bentyl. Advised to take prn, up to three times a day   3. [facility] of colon cancer in father.  -Will request virtual colonoscopy report from [facility] to determine date of surveillance colonoscopy. She may be due now since it has been 5 years sine virtual.   4. 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 She will need MRI to better characterize lesions. Will arrange for this following her EGD.    Tye Savoy, NP  01/28/2017, 10:02 AM

## 2017-02-02 ENCOUNTER — Encounter: Payer: Self-pay | Admitting: Gastroenterology

## 2017-02-02 ENCOUNTER — Ambulatory Visit (AMBULATORY_SURGERY_CENTER): Payer: Managed Care, Other (non HMO) | Admitting: Gastroenterology

## 2017-02-02 VITALS — BP 163/84 | HR 54 | Temp 95.5°F | Resp 18 | Ht 63.0 in | Wt 181.0 lb

## 2017-02-02 DIAGNOSIS — K319 Disease of stomach and duodenum, unspecified: Secondary | ICD-10-CM

## 2017-02-02 DIAGNOSIS — R109 Unspecified abdominal pain: Secondary | ICD-10-CM

## 2017-02-02 MED ORDER — OMEPRAZOLE 40 MG PO CPDR: 40.0000 mg | DELAYED_RELEASE_CAPSULE | Freq: Two times a day (BID) | ORAL | 3 refills | Status: DC

## 2017-02-02 MED ORDER — SUCRALFATE 1 GM/10ML PO SUSP: 1.0000 g | Freq: Three times a day (TID) | ORAL | 0 refills | Status: DC

## 2017-02-02 MED ORDER — SODIUM CHLORIDE 0.9 % IV SOLN: 500.0000 mL | INTRAVENOUS | Status: DC

## 2017-02-02 NOTE — Progress Notes (Signed)
Called to room to assist during endoscopic procedure.  Patient ID and intended procedure confirmed with present staff. Received instructions for my participation in the procedure from the performing physician.  

## 2017-02-02 NOTE — Patient Instructions (Signed)
**   Avoid NSAIDS ** Aspirin, Ibuprofen, Naproxen and any other non-steroidal anti-inflammatory drugs! Tylenol is okay!   YOU HAD AN ENDOSCOPIC PROCEDURE TODAY AT [facility]:   Refer to the procedure report that was given to you for any specific questions about what was found during the examination.  If the procedure report does not answer your questions, please call your gastroenterologist to clarify.  If you requested that your care partner not be given the details of your procedure findings, then the procedure report has been included in a sealed envelope for you to review at your convenience later.  YOU SHOULD EXPECT: Some feelings of bloating in the abdomen. Passage of more gas than usual.  Walking can help get rid of the air that was put into your GI tract during the procedure and reduce the bloating. If you had a lower endoscopy (such as a colonoscopy or flexible sigmoidoscopy) you may notice spotting of blood in your stool or on the toilet paper. If you underwent a bowel prep for your procedure, you may not have a normal bowel movement for a few days.  Please Note:  You might notice some irritation and congestion in your nose or some drainage.  This is from the oxygen used during your procedure.  There is no need for concern and it should clear up in a day or so.  SYMPTOMS TO REPORT IMMEDIATELY:   Following upper endoscopy (EGD)  Vomiting of blood or coffee ground material  New chest pain or pain under the shoulder blades  Painful or persistently difficult swallowing  New shortness of breath  Fever of 100F or higher  Black, tarry-looking stools  For urgent or emergent issues, a gastroenterologist can be reached at any hour by calling [phone].   DIET:  We do recommend a small meal at first, but then you may proceed to your regular diet.  Drink plenty of fluids but you should avoid alcoholic beverages for 24 hours.  ACTIVITY:  You should plan to take it easy for  the rest of today and you should NOT DRIVE or use heavy machinery until tomorrow (because of the sedation medicines used during the test).    FOLLOW UP: Our staff will call the number listed on your records the next business day following your procedure to check on you and address any questions or concerns that you may have regarding the information given to you following your procedure. If we do not reach you, we will leave a message.  However, if you are feeling well and you are not experiencing any problems, there is no need to return our call.  We will assume that you have returned to your regular daily activities without incident.  If any biopsies were taken you will be contacted by phone or by letter within the next 1-3 weeks.  Please call us at [phone] if you have not heard about the biopsies in 3 weeks.    SIGNATURES/CONFIDENTIALITY: You and/or your care partner have signed paperwork which will be entered into your electronic medical record.  These signatures attest to the fact that that the information above on your After Visit Summary has been reviewed and is understood.  Full responsibility of the confidentiality of this discharge information lies with you and/or your care-partner.

## 2017-02-02 NOTE — Progress Notes (Signed)
Pt's states no medical or surgical changes since previsit or office visit. 

## 2017-02-02 NOTE — Progress Notes (Signed)
Report to PACU, RN, vss, BBS= Clear.  

## 2017-02-02 NOTE — Progress Notes (Signed)
Reviewed and agree with documentation and assessment and plan. K. Veena Bashar Milam , MD   

## 2017-02-02 NOTE — Op Note (Signed)
[facility] Patient Name: Faith [name] Procedure Date: 02/02/2017 7:58 AM MRN: [id number] Endoscopist: Mauri Pole , MD Age: 61 Referring MD:  Date of Birth: 1956-02-02 Gender: Female Account #: [id number] Procedure:                Upper GI endoscopy Indications:              Epigastric abdominal pain, Abnormal CT abd & pelvis Medicines:                Monitored Anesthesia Care Procedure:                Pre-Anesthesia Assessment:                           - Prior to the procedure, a History and Physical                            was performed, and patient medications and                            allergies were reviewed. The patient's tolerance of                            previous anesthesia was also reviewed. The risks                            and benefits of the procedure and the sedation                            options and risks were discussed with the patient.                            All questions were answered, and informed consent                            was obtained. Prior Anticoagulants: The patient has                            taken no previous anticoagulant or antiplatelet                            agents. ASA Grade Assessment: II - A patient with                            mild systemic disease. After reviewing the risks                            and benefits, the patient was deemed in                            satisfactory condition to undergo the procedure.                           After obtaining informed consent, the endoscope was  passed under direct vision. Throughout the                            procedure, the patient's blood pressure, pulse, and                            oxygen saturations were monitored continuously. The                            Model GIF-[id number]) scope was introduced                            through the mouth, and advanced to the afferent and       efferent jejunal loops. The upper GI endoscopy was                            accomplished without difficulty. The patient                            tolerated the procedure well. Scope In: Scope Out: Findings:                 The esophagus was normal.                           Evidence of a Roux-en-Y gastrojejunostomy was                            found. The gastrojejunal anastomosis was                            characterized by healthy appearing mucosa. This was                            traversed. The pouch-to-jejunum limb was                            characterized by ulceration. The jejunojejunal                            anastomosis was characterized by ulceration. The                            duodenum-to-jejunum limb was not examined as it                            could not be reached. Biopsies were taken with a                            cold forceps for histology.                           The examined jejunum was normal. Complications:            No immediate complications. Estimated Blood Loss:     Estimated blood loss was minimal. Impression:               -  Normal esophagus.                           - Roux-en-Y gastrojejunostomy with gastrojejunal                            anastomosis characterized by healthy appearing                            mucosa. Biopsied.                           - Normal examined jejunum. Recommendation:           - Resume previous diet.                           - Continue present medications.                           - No aspirin, ibuprofen, naproxen, or other                            non-steroidal anti-inflammatory drugs.                           - Await pathology results. Mauri Pole, MD 02/02/2017 8:28:37 AM This report has been signed electronically.

## 2017-02-03 ENCOUNTER — Telehealth: Payer: Self-pay | Admitting: Gastroenterology

## 2017-02-03 ENCOUNTER — Telehealth: Payer: Self-pay | Admitting: *Deleted

## 2017-02-03 DIAGNOSIS — E279 Disorder of adrenal gland, unspecified: Secondary | ICD-10-CM

## 2017-02-03 DIAGNOSIS — K769 Liver disease, unspecified: Secondary | ICD-10-CM

## 2017-02-03 NOTE — Telephone Encounter (Signed)
No answer. Name identifier. Message left call if questions or concerns.

## 2017-02-03 NOTE — Telephone Encounter (Signed)
No answer. Name identifier. Message left to call if questions or concerns and that we would make an attempt later in the day to reach.

## 2017-02-04 NOTE — Telephone Encounter (Signed)
Please let Deneise Lever know that I spoke to Dr. Silverio Decamp and she feels the ulceration were likely source of her abdominal pain. So now need to further evaluation of pain. Therefore we only need to evaluate liver and adrenal lesion and will do that with the MRI. Beth can you get her scheduled for abd MRI to evaluated the liver and adrenal lesions seen on CT scan. Thanks

## 2017-02-04 NOTE — Telephone Encounter (Signed)
Faith [name] please advise on MRI study and when does pt need to have colonoscopy and with whom. Thanks, Peter Congo

## 2017-02-04 NOTE — Telephone Encounter (Signed)
Forwarded to [employer].

## 2017-02-05 NOTE — Telephone Encounter (Signed)
Patient notified of the recommendations. Patient notified of MRI scheduled for 02/16/17 at 9:00.  [address] at [facility].  Patient advised to be NPO after midnight.

## 2017-02-08 ENCOUNTER — Ambulatory Visit: Payer: Managed Care, Other (non HMO) | Admitting: Nurse Practitioner

## 2017-02-16 ENCOUNTER — Ambulatory Visit (HOSPITAL_COMMUNITY)
Admission: RE | Admit: 2017-02-16 | Discharge: 2017-02-16 | Disposition: A | Discharge status: Home / Self Care | Payer: Managed Care, Other (non HMO) | Source: Ambulatory Visit | Attending: Nurse Practitioner | Admitting: Nurse Practitioner

## 2017-02-16 DIAGNOSIS — R160 Hepatomegaly, not elsewhere classified: Secondary | ICD-10-CM | POA: Diagnosis not present

## 2017-02-16 DIAGNOSIS — E279 Disorder of adrenal gland, unspecified: Secondary | ICD-10-CM | POA: Diagnosis present

## 2017-02-16 DIAGNOSIS — K769 Liver disease, unspecified: Secondary | ICD-10-CM

## 2017-02-16 DIAGNOSIS — K764 Peliosis hepatis: Secondary | ICD-10-CM | POA: Diagnosis present

## 2017-02-16 MED ORDER — GADOBENATE DIMEGLUMINE 529 MG/ML IV SOLN
20.0000 mL | Freq: Once | INTRAVENOUS | Status: AC | PRN
  Administered 2017-02-16: 17 mL via INTRAVENOUS

## 2017-02-22 ENCOUNTER — Telehealth: Payer: Self-pay | Admitting: Gastroenterology

## 2017-02-23 NOTE — Telephone Encounter (Signed)
Pt is calling about her MRI results

## 2017-02-24 NOTE — Telephone Encounter (Signed)
-----   Message from Mauri Pole, MD sent at 02/24/2017  3:15 PM EDT ----- Eustaquio Maize, when you call patient, can you also advise her to avoid any hormone replacements or herbal remedies. Thanks

## 2017-02-24 NOTE — Telephone Encounter (Signed)
Ok to go ahead with scheduling the colonoscopy. Last procedure was incomplete and she had virtual CT. Thanks

## 2017-02-24 NOTE — Telephone Encounter (Signed)
Patient requests to have her screening colonoscopy before the end of the year. Please advise.

## 2017-02-25 NOTE — Telephone Encounter (Signed)
Left a message to call back to schedule her screening colonoscopy in the [facility].

## 2017-03-01 ENCOUNTER — Encounter: Payer: Self-pay | Admitting: Gastroenterology

## 2017-03-02 ENCOUNTER — Other Ambulatory Visit: Payer: Self-pay | Admitting: Gastroenterology

## 2017-03-02 ENCOUNTER — Ambulatory Visit: Payer: Managed Care, Other (non HMO) | Admitting: Gastroenterology

## 2017-03-24 ENCOUNTER — Encounter: Payer: Self-pay | Admitting: Gastroenterology

## 2017-03-24 ENCOUNTER — Ambulatory Visit (AMBULATORY_SURGERY_CENTER): Payer: Self-pay | Admitting: *Deleted

## 2017-03-24 VITALS — Ht 63.0 in | Wt 179.0 lb

## 2017-03-24 DIAGNOSIS — Z800 Family history of malignant neoplasm of digestive organs: Secondary | ICD-10-CM

## 2017-03-24 MED ORDER — NA SULFATE-K SULFATE-MG SULF 17.5-3.13-1.6 GM/177ML PO SOLN: 1.0000 | Freq: Once | ORAL | 0 refills | Status: AC

## 2017-03-24 NOTE — Progress Notes (Signed)
No egg or soy allergy known to patient  No issues with past sedation with any surgeries  or procedures, no intubation problems  No diet pills per patient No home 02 use per patient  No blood thinners per patient  Pt denies issues with constipation  No A fib or A flutter  EMMI video sent to pt's e mail  $15 coupon for suprep to pt today in [place]

## 2017-04-02 ENCOUNTER — Other Ambulatory Visit: Payer: Self-pay

## 2017-04-02 ENCOUNTER — Encounter: Payer: Self-pay | Admitting: Gastroenterology

## 2017-04-02 ENCOUNTER — Ambulatory Visit (AMBULATORY_SURGERY_CENTER): Payer: Managed Care, Other (non HMO) | Admitting: Gastroenterology

## 2017-04-02 VITALS — BP 125/74 | HR 56 | Temp 97.8°F | Resp 14 | Ht 63.0 in | Wt 179.0 lb

## 2017-04-02 DIAGNOSIS — K635 Polyp of colon: Secondary | ICD-10-CM | POA: Diagnosis not present

## 2017-04-02 DIAGNOSIS — Z1212 Encounter for screening for malignant neoplasm of rectum: Secondary | ICD-10-CM

## 2017-04-02 DIAGNOSIS — D125 Benign neoplasm of sigmoid colon: Secondary | ICD-10-CM

## 2017-04-02 DIAGNOSIS — Z800 Family history of malignant neoplasm of digestive organs: Secondary | ICD-10-CM

## 2017-04-02 DIAGNOSIS — Z1211 Encounter for screening for malignant neoplasm of colon: Secondary | ICD-10-CM | POA: Diagnosis not present

## 2017-04-02 DIAGNOSIS — D122 Benign neoplasm of ascending colon: Secondary | ICD-10-CM | POA: Diagnosis not present

## 2017-04-02 DIAGNOSIS — D120 Benign neoplasm of cecum: Secondary | ICD-10-CM | POA: Diagnosis not present

## 2017-04-02 MED ORDER — SODIUM CHLORIDE 0.9 % IV SOLN: 500.0000 mL | INTRAVENOUS | Status: DC

## 2017-04-02 NOTE — Op Note (Signed)
[facility] Patient Name: Faith [name] Procedure Date: 04/02/2017 2:16 PM MRN: [id number] Endoscopist: Mauri Pole , MD Age: 61 Referring MD:  Date of Birth: Dec 20, 1955 Gender: Female Account #: [id number] Procedure:                Colonoscopy Indications:              Screening in patient at increased risk: Family                            history of 1st-degree relative with colorectal                            cancer before age 30 years Medicines:                Monitored Anesthesia Care Procedure:                Pre-Anesthesia Assessment:                           - Prior to the procedure, a History and Physical                            was performed, and patient medications and                            allergies were reviewed. The patient's tolerance of                            previous anesthesia was also reviewed. The risks                            and benefits of the procedure and the sedation                            options and risks were discussed with the patient.                            All questions were answered, and informed consent                            was obtained. Prior Anticoagulants: The patient has                            taken no previous anticoagulant or antiplatelet                            agents. ASA Grade Assessment: II - A patient with                            mild systemic disease. After reviewing the risks                            and benefits, the patient was deemed in  satisfactory condition to undergo the procedure.                           After obtaining informed consent, the colonoscope                            was passed under direct vision. Throughout the                            procedure, the patient's blood pressure, pulse, and                            oxygen saturations were monitored continuously. The                            Model PCF-[id number])  scope was introduced                            through the anus and advanced to the the cecum,                            identified by appendiceal orifice and ileocecal                            valve. The colonoscopy was performed without                            difficulty. The patient tolerated the procedure                            well. The quality of the bowel preparation was                            excellent. The ileocecal valve, appendiceal                            orifice, and rectum were photographed. Scope In: 2:25:27 PM Scope Out: 2:38:04 PM Scope Withdrawal Time: 0 hours 9 minutes 25 seconds  Total Procedure Duration: 0 hours 12 minutes 37 seconds  Findings:                 The perianal and digital rectal examinations were                            normal.                           A 4 mm polyp was found in the ascending colon. The                            polyp was sessile. The polyp was removed with a                            cold snare. Resection and retrieval were complete.  Two sessile polyps were found in the sigmoid colon                            and cecum. The polyps were 1 to 2 mm in size. These                            polyps were removed with a cold biopsy forceps.                            Resection and retrieval were complete.                           Non-bleeding internal hemorrhoids were found during                            retroflexion. The hemorrhoids were small. Complications:            No immediate complications. Estimated Blood Loss:     Estimated blood loss was minimal. Impression:               - One 4 mm polyp in the ascending colon, removed                            with a cold snare. Resected and retrieved.                           - Two 1 to 2 mm polyps in the sigmoid colon and in                            the cecum, removed with a cold biopsy forceps.                            Resected and  retrieved.                           - Non-bleeding internal hemorrhoids. Recommendation:           - Patient has a contact number available for                            emergencies. The signs and symptoms of potential                            delayed complications were discussed with the                            patient. Return to normal activities tomorrow.                            Written discharge instructions were provided to the                            patient.                           -  Resume previous diet.                           - Continue present medications.                           - Await pathology results.                           - Repeat colonoscopy in 5 years for surveillance                            based on pathology results. Mauri Pole, MD 04/02/2017 2:42:00 PM This report has been signed electronically.

## 2017-04-02 NOTE — Patient Instructions (Signed)
Handouts:  Polyps and Hemorrhoids  YOU HAD AN ENDOSCOPIC PROCEDURE TODAY AT THE [facility]:   Refer to the procedure report that was given to you for any specific questions about what was found during the examination.  If the procedure report does not answer your questions, please call your gastroenterologist to clarify.  If you requested that your care partner not be given the details of your procedure findings, then the procedure report has been included in a sealed envelope for you to review at your convenience later.  YOU SHOULD EXPECT: Some feelings of bloating in the abdomen. Passage of more gas than usual.  Walking can help get rid of the air that was put into your GI tract during the procedure and reduce the bloating. If you had a lower endoscopy (such as a colonoscopy or flexible sigmoidoscopy) you may notice spotting of blood in your stool or on the toilet paper. If you underwent a bowel prep for your procedure, you may not have a normal bowel movement for a few days.  Please Note:  You might notice some irritation and congestion in your nose or some drainage.  This is from the oxygen used during your procedure.  There is no need for concern and it should clear up in a day or so.  SYMPTOMS TO REPORT IMMEDIATELY:   Following lower endoscopy (colonoscopy or flexible sigmoidoscopy):  Excessive amounts of blood in the stool  Significant tenderness or worsening of abdominal pains  Swelling of the abdomen that is new, acute  Fever of 100F or higher  For urgent or emergent issues, a gastroenterologist can be reached at any hour by calling [phone].   DIET:  We do recommend a small meal at first, but then you may proceed to your regular diet.  Drink plenty of fluids but you should avoid alcoholic beverages for 24 hours.  ACTIVITY:  You should plan to take it easy for the rest of today and you should NOT DRIVE or use heavy machinery until tomorrow (because of the sedation  medicines used during the test).    FOLLOW UP: Our staff will call the number listed on your records the next business day following your procedure to check on you and address any questions or concerns that you may have regarding the information given to you following your procedure. If we do not reach you, we will leave a message.  However, if you are feeling well and you are not experiencing any problems, there is no need to return our call.  We will assume that you have returned to your regular daily activities without incident.  If any biopsies were taken you will be contacted by phone or by letter within the next 1-3 weeks.  Please call us at [phone] if you have not heard about the biopsies in 3 weeks.    SIGNATURES/CONFIDENTIALITY: You and/or your care partner have signed paperwork which will be entered into your electronic medical record.  These signatures attest to the fact that that the information above on your After Visit Summary has been reviewed and is understood.  Full responsibility of the confidentiality of this discharge information lies with you and/or your care-partner. 

## 2017-04-02 NOTE — Progress Notes (Signed)
Report given to PACU, vss 

## 2017-04-02 NOTE — Progress Notes (Signed)
Pt's states no medical or surgical changes since previsit or office visit. 

## 2017-04-02 NOTE — Progress Notes (Signed)
Called to room to assist during endoscopic procedure.  Patient ID and intended procedure confirmed with present staff. Received instructions for my participation in the procedure from the performing physician.  

## 2017-04-05 ENCOUNTER — Telehealth: Payer: Self-pay | Admitting: *Deleted

## 2017-04-05 ENCOUNTER — Telehealth: Payer: Self-pay

## 2017-04-05 NOTE — Telephone Encounter (Signed)
Called [phone] and left a messaged we tried to reach pt for a follow up call. maw

## 2017-04-05 NOTE — Telephone Encounter (Signed)
  Follow up Call-  Call back number 04/02/2017 02/02/2017  Post procedure Call Back phone  # [phone]  Permission to leave phone message Yes Yes  Some recent data might be hidden     Patient questions:  Do you have a fever, pain , or abdominal swelling? No. Pain Score  0 *  Have you tolerated food without any problems? Yes.    Have you been able to return to your normal activities? Yes.    Do you have any questions about your discharge instructions: Diet   No. Medications  No. Follow up visit  No.  Do you have questions or concerns about your Care? No.  Actions: * If pain score is 4 or above: No action needed, pain <4.

## 2017-04-13 ENCOUNTER — Encounter: Payer: Self-pay | Admitting: Gastroenterology

## 2017-04-15 ENCOUNTER — Telehealth: Payer: Self-pay | Admitting: Gastroenterology

## 2017-04-15 NOTE — Telephone Encounter (Signed)
Letter read to the patient.

## 2017-04-15 NOTE — Telephone Encounter (Signed)
Patient requesting a call for results from colon done on 11.16.18

## 2017-04-22 ENCOUNTER — Telehealth: Payer: Self-pay | Admitting: Gastroenterology

## 2017-04-22 NOTE — Telephone Encounter (Signed)
She is without symptoms. Taking her PPI. She is concerned that the EGD will be cost prohibitive by March of 2019 as it will be completely out of pocket.

## 2017-04-22 NOTE — Telephone Encounter (Signed)
Patient wanting to know if she can have her follow up endo due to an ulcer before the end of the year. Pt wanting to speak with nurse to discuss.

## 2017-04-22 NOTE — Telephone Encounter (Signed)
I called her back. Voicemail. She had previously been scheduled for a recall in March 2019. I have asked she call us back. Wanted to be certain she is not experiencing any GI symptoms. Please advise.

## 2017-04-23 ENCOUNTER — Other Ambulatory Visit: Payer: Self-pay

## 2017-04-23 DIAGNOSIS — K253 Acute gastric ulcer without hemorrhage or perforation: Secondary | ICD-10-CM

## 2017-04-23 NOTE — Telephone Encounter (Signed)
Ok to schedule EGD this month to check for ulcer healing and repeat biopsy. Other option is to obtain upper GI series if patient doesn't want to proceed with EGD.

## 2017-04-23 NOTE — Telephone Encounter (Signed)
The timing for a repeat EGD will not work for her. She is willing to have the upper GI series. Scheduled for her on 05/05/17 at 9:30 am in [facility]. Instructed to fast for her test beginning at midnight.

## 2017-05-05 ENCOUNTER — Other Ambulatory Visit: Payer: Self-pay | Admitting: Gastroenterology

## 2017-05-05 ENCOUNTER — Ambulatory Visit (HOSPITAL_COMMUNITY)
Admission: RE | Admit: 2017-05-05 | Discharge: 2017-05-05 | Disposition: A | Discharge status: Home / Self Care | Payer: Managed Care, Other (non HMO) | Source: Ambulatory Visit | Attending: Gastroenterology | Admitting: Gastroenterology

## 2017-05-05 DIAGNOSIS — K253 Acute gastric ulcer without hemorrhage or perforation: Secondary | ICD-10-CM | POA: Insufficient documentation

## 2017-05-07 ENCOUNTER — Other Ambulatory Visit: Payer: Self-pay

## 2017-05-07 ENCOUNTER — Telehealth: Payer: Self-pay | Admitting: Gastroenterology

## 2017-05-07 DIAGNOSIS — R197 Diarrhea, unspecified: Secondary | ICD-10-CM

## 2017-05-07 MED ORDER — DIPHENOXYLATE-ATROPINE 2.5-0.025 MG PO TABS: 1.0000 | ORAL_TABLET | Freq: Four times a day (QID) | ORAL | 0 refills | Status: DC | PRN

## 2017-05-07 NOTE — Telephone Encounter (Signed)
On endoscopy it was large about 2cm, its hard to compare with a radiologic exam as they are not directly visualizing it. But it was still there and radiologist measured it as 24mm. I will call patient.

## 2017-05-07 NOTE — Telephone Encounter (Signed)
Patient is requesting this refill to be sent to [facility] on [address]

## 2017-05-07 NOTE — Telephone Encounter (Signed)
Called patient. She says she gotten sick after the radiologic exam, has been having diarrhea since, could be secondary to barium, but will check GI Pathogen panel to exclude infectious etiology.  She is having her 61th birthday party tonight and she is worried about having diarrhea during the party. Please send Rx for [facility] on [address] for Lomotil 1 tab q8h PRN X 30 tabs with no refills. Thanks

## 2017-05-10 ENCOUNTER — Encounter: Payer: Managed Care, Other (non HMO) | Admitting: Gastroenterology

## 2017-05-14 ENCOUNTER — Other Ambulatory Visit: Payer: Self-pay | Admitting: Gastroenterology

## 2017-05-14 ENCOUNTER — Telehealth: Payer: Self-pay | Admitting: Gastroenterology

## 2017-05-14 ENCOUNTER — Other Ambulatory Visit: Payer: Self-pay

## 2017-05-14 MED ORDER — SUCRALFATE 1 GM/10ML PO SUSP: ORAL | 0 refills | Status: DC

## 2017-05-14 NOTE — Telephone Encounter (Signed)
Per [facility] pharmacy patient request 90 day supply of carafate.  I spoke with Dr Silverio Decamp and she said this was okay. Sent in.

## 2017-05-17 NOTE — Telephone Encounter (Signed)
Looks like on Friday Dr Silverio Decamp sent in a 90 day supply of carafate for patient

## 2017-06-24 ENCOUNTER — Encounter: Payer: Self-pay | Admitting: Gastroenterology

## 2017-10-04 ENCOUNTER — Other Ambulatory Visit: Payer: Self-pay | Admitting: Internal Medicine

## 2017-10-04 DIAGNOSIS — Z1231 Encounter for screening mammogram for malignant neoplasm of breast: Secondary | ICD-10-CM

## 2017-10-26 ENCOUNTER — Ambulatory Visit
Admission: RE | Admit: 2017-10-26 | Discharge: 2017-10-26 | Disposition: A | Discharge status: Home / Self Care | Payer: Managed Care, Other (non HMO) | Source: Ambulatory Visit | Attending: Internal Medicine | Admitting: Internal Medicine

## 2017-10-26 DIAGNOSIS — Z1231 Encounter for screening mammogram for malignant neoplasm of breast: Secondary | ICD-10-CM

## 2017-10-27 ENCOUNTER — Encounter: Payer: Self-pay | Admitting: Gastroenterology

## 2017-10-27 ENCOUNTER — Ambulatory Visit (INDEPENDENT_AMBULATORY_CARE_PROVIDER_SITE_OTHER): Payer: Managed Care, Other (non HMO) | Admitting: Gastroenterology

## 2017-10-27 VITALS — BP 124/84 | HR 64 | Ht 63.0 in | Wt 172.1 lb

## 2017-10-27 DIAGNOSIS — K648 Other hemorrhoids: Secondary | ICD-10-CM | POA: Diagnosis not present

## 2017-10-27 DIAGNOSIS — K289 Gastrojejunal ulcer, unspecified as acute or chronic, without hemorrhage or perforation: Secondary | ICD-10-CM

## 2017-10-27 DIAGNOSIS — K625 Hemorrhage of anus and rectum: Secondary | ICD-10-CM

## 2017-10-27 DIAGNOSIS — K5909 Other constipation: Secondary | ICD-10-CM | POA: Diagnosis not present

## 2017-10-27 NOTE — Progress Notes (Signed)
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 EGD September 2018 showed large cratered anastomotic ulcer Colonoscopy April 02, 2017 removal of 2 small sessile tubular adenomas and one hyperplastic polyp BM every other day, on and off constipation worse with travel.  She has noticed intermittent small-volume bright red blood per rectum when she wipes after a bowel movement. She is currently taking Carafate almost daily at bedtime and is with taking omeprazole 40 mg intermittently.  No longer having severe epigastric discomfort or heartburn. No nausea, vomiting, dysphagia or odynophagia. She continues to use naproxen or ibuprofen intermittently for arthritis Planning to switch Boniva to alternative medication as patient thinks it may be 1 of the culprits for abdominal discomfort and gastropathy.  Outpatient Encounter Medications as of 10/27/2017  Medication Sig  . CALCIUM PO Take 1 tablet by mouth daily. Adult chewable calcium supplement   . diphenoxylate-atropine (LOMOTIL) 2.5-0.025 MG tablet Take 1 tablet by mouth 4 (four) times daily as needed for diarrhea or loose stools.  . ferrous sulfate 325 (65 FE) MG tablet Take 325 mg by mouth daily with breakfast.  . ibuprofen (ADVIL,MOTRIN) 200 MG tablet Take 600 mg by mouth every 6 (six) hours as needed. For pain   . levocetirizine (XYZAL) 5 MG tablet Take 1 tablet by mouth daily.  Marland Kitchen lisinopril (PRINIVIL,ZESTRIL) 20 MG tablet Take 20 mg by mouth.  . Multiple Vitamin (MULITIVITAMIN WITH MINERALS) TABS Take 3 tablets by mouth daily. Adult gummy multi-vitamins   . omeprazole  (PRILOSEC) 40 MG capsule TAKE ONE CAPSULE BY MOUTH TWICE DAILY, 30 MINUTES BEFORE BREAKFAST  . Probiotic Product (PROBIOTIC DAILY PO) Take 1 capsule daily by mouth.  . sucralfate (CARAFATE) 1 GM/10ML suspension TAKE 10 ML BY MOUTH FOUR TIMES DAILY( WITH MEALS AND AT BEDTIME)  . triamcinolone cream (KENALOG) 0.1 % Apply 1 application 3 times/day as needed-between meals & bedtime topically.  . dicyclomine (BENTYL) 20 MG tablet Take 1 tablet by mouth 2 (two) times daily as needed.  . [DISCONTINUED] ibandronate (BONIVA) 150 MG tablet Take 1 tablet (150 mg total) by mouth every 30 (thirty) days. Take in the morning with a full glass of water, on an empty stomach, and do not take anything else by mouth or lie down for the next 30 min.   No facility-administered encounter medications on file as of 10/27/2017.     Allergies as of 10/27/2017  . (No Known Allergies)    Past Medical History:  Diagnosis Date  . Allergy    spring   . Anemia    yrs ago   . Arthritis   . Family history of colon cancer in father   . Gastric ulcer   . GERD (gastroesophageal reflux disease)   . Hypertension   . MVP (mitral valve prolapse)   . Osteopenia     Past Surgical History:  Procedure Laterality Date  . BREAST EXCISIONAL BIOPSY Right 1986  . BREAST REDUCTION SURGERY    . CHOLECYSTECTOMY    . COLONOSCOPY    . GASTRIC BYPASS    . KNEE ARTHROSCOPY     right  . REDUCTION MAMMAPLASTY Bilateral 2001  .  TEMPOROMANDIBULAR JOINT SURGERY    . TONSILLECTOMY AND ADENOIDECTOMY    . TUBAL LIGATION    . UPPER GASTROINTESTINAL ENDOSCOPY      Family History  Adopted: Yes  Problem Relation Age of Onset  . Colon cancer Father 20    Social History   Socioeconomic History  . Marital status: Married    Spouse name: Not on file  . Number of children: 2  . Years of education: Not on file  . Highest education level: Not on file  Occupational History  . Occupation: Mental Health Therapist  Social Needs  .  Financial resource strain: Not on file  . Food insecurity:    Worry: Not on file    Inability: Not on file  . Transportation needs:    Medical: Not on file    Non-medical: Not on file  Tobacco Use  . Smoking status: Never Smoker  . Smokeless tobacco: Never Used  Substance and Sexual Activity  . Alcohol use: Yes    Alcohol/week: 0.0 oz    Comment: rare  . Drug use: No  . Sexual activity: Not on file  Lifestyle  . Physical activity:    Days per week: Not on file    Minutes per session: Not on file  . Stress: Not on file  Relationships  . Social connections:    Talks on phone: Not on file    Gets together: Not on file    Attends religious service: Not on file    Active member of club or organization: Not on file    Attends meetings of clubs or organizations: Not on file    Relationship status: Not on file  . Intimate partner violence:    Fear of current or ex partner: Not on file    Emotionally abused: Not on file    Physically abused: Not on file    Forced sexual activity: Not on file  Other Topics Concern  . Not on file  Social History Narrative  . Not on file      Review of systems: Review of Systems  Constitutional: Negative for fever and chills.  HENT: Positive for sinus problem   Eyes: Negative for blurred vision.  Respiratory: Negative for cough, shortness of breath and wheezing.   Cardiovascular: Negative for chest pain and palpitations.  Gastrointestinal: as per HPI Genitourinary: Negative for dysuria, urgency, frequency and hematuria.  Musculoskeletal: Positive for myalgias, back pain and joint pain.  Skin: Negative for itching and rash.  Neurological: Negative for dizziness, tremors, focal weakness, seizures and loss of consciousness.  Endo/Heme/Allergies: Positive for seasonal allergies.  Psychiatric/Behavioral: Negative for depression, suicidal ideas and hallucinations.  All other systems reviewed and are negative.   Physical Exam: Vitals:    10/27/17 1025  BP: 124/84  Pulse: 64   Body mass index is 30.49 kg/m. Gen:      No acute distress HEENT:  EOMI, sclera anicteric Neck:     No masses; no thyromegaly Lungs:    Clear to auscultation bilaterally; normal respiratory effort CV:         Regular rate and rhythm; no murmurs Abd:      + bowel sounds; soft, non-tender; no palpable masses, no distension Ext:    No edema; adequate peripheral perfusion Skin:      Warm and dry; no rash Neuro: alert and oriented x 3 Psych: normal mood and affect  Data Reviewed:  Reviewed labs, radiology imaging, old records and pertinent past GI work up  Assessment and Plan/Recommendations:  62 year old female with history of Roux-en-Y, large cratered anastomotic ulcer in the setting of frequent NSAID use, IBS predominant constipation here for follow-up visit  Gastrojejunal anastomotic ulcer: Advised patient to continue omeprazole 20 mg daily, she is planning to finish the 40 mg daily prescription she has and will call us to obtain the new prescription for 20 mg daily Avoid NSAIDs Planning to switch Boniva by PMD Okay to use Carafate at bedtime as needed  Irritable bowel syndrome predominant constipation MiraLAX half to 1 capful daily as needed Fiberchoice tablets 1-2 daily Increase  dietary fluid intake  Symptomatic hemorrhoids: Avoid excessive straining Preparation H suppository at bedtime as needed If continues to have persistent symptoms we will consider hemorrhoidal band ligation  Due for surveillance colonoscopy for colorectal cancer screening in November 2023 due to history of adenomatous colon polyps  25 minutes was spent face-to-face with the patient. Greater than 50% of the time used for counseling as well as treatment plan and follow-up. She had multiple questions which were answered to her satisfaction  K. Denzil Magnuson , MD [phone]    CC: Nickola Major, MD

## 2017-10-27 NOTE — Patient Instructions (Addendum)
If you are age 61 or older, your body mass index should be between 23-30. Your Body mass index is 30.49 kg/m. If this is out of the aforementioned range listed, please consider follow up with your Primary Care Provider.  If you are age 66 or younger, your body mass index should be between 19-25. Your Body mass index is 30.49 kg/m. If this is out of the aformentioned range listed, please consider follow up with your Primary Care Provider.    Follow up in 6 months  Use FiberChoice Tablets 1-2 daily  Samples given  Use Miralax 1/2 - 1 capful daily  Use OTC Preparation H suppositories as needed at bedtime  Finish taking your Omeprazole 20 mg daily call after you complete your prescription

## 2017-10-29 ENCOUNTER — Encounter: Payer: Self-pay | Admitting: Gastroenterology

## 2017-11-22 ENCOUNTER — Other Ambulatory Visit (HOSPITAL_COMMUNITY): Payer: Self-pay | Admitting: *Deleted

## 2017-11-23 ENCOUNTER — Ambulatory Visit (HOSPITAL_COMMUNITY)
Admission: RE | Admit: 2017-11-23 | Discharge: 2017-11-23 | Disposition: A | Discharge status: Home / Self Care | Payer: Managed Care, Other (non HMO) | Source: Ambulatory Visit | Attending: Internal Medicine | Admitting: Internal Medicine

## 2017-11-23 DIAGNOSIS — M810 Age-related osteoporosis without current pathological fracture: Secondary | ICD-10-CM | POA: Insufficient documentation

## 2017-11-23 MED ORDER — ZOLEDRONIC ACID 5 MG/100ML IV SOLN
INTRAVENOUS | Status: AC
  Filled 2017-11-23: qty 100

## 2017-11-23 MED ORDER — ZOLEDRONIC ACID 5 MG/100ML IV SOLN
5.0000 mg | Freq: Once | INTRAVENOUS | Status: AC
  Administered 2017-11-23: 5 mg via INTRAVENOUS

## 2017-11-23 NOTE — Discharge Instructions (Signed)

## 2018-03-21 ENCOUNTER — Emergency Department (HOSPITAL_COMMUNITY): Payer: Managed Care, Other (non HMO)

## 2018-03-21 ENCOUNTER — Emergency Department (HOSPITAL_COMMUNITY)
Admission: EM | Admit: 2018-03-21 | Discharge: 2018-03-22 | Disposition: A | Discharge status: Home / Self Care | Payer: Managed Care, Other (non HMO) | Attending: Emergency Medicine | Admitting: Emergency Medicine

## 2018-03-21 ENCOUNTER — Encounter (HOSPITAL_COMMUNITY): Payer: Self-pay | Admitting: Emergency Medicine

## 2018-03-21 ENCOUNTER — Other Ambulatory Visit: Payer: Self-pay

## 2018-03-21 DIAGNOSIS — Y9389 Activity, other specified: Secondary | ICD-10-CM | POA: Insufficient documentation

## 2018-03-21 DIAGNOSIS — Y999 Unspecified external cause status: Secondary | ICD-10-CM | POA: Insufficient documentation

## 2018-03-21 DIAGNOSIS — Z79899 Other long term (current) drug therapy: Secondary | ICD-10-CM | POA: Insufficient documentation

## 2018-03-21 DIAGNOSIS — S82044A Nondisplaced comminuted fracture of right patella, initial encounter for closed fracture: Secondary | ICD-10-CM

## 2018-03-21 DIAGNOSIS — M545 Low back pain, unspecified: Secondary | ICD-10-CM

## 2018-03-21 DIAGNOSIS — Y929 Unspecified place or not applicable: Secondary | ICD-10-CM | POA: Diagnosis not present

## 2018-03-21 DIAGNOSIS — I10 Essential (primary) hypertension: Secondary | ICD-10-CM | POA: Diagnosis not present

## 2018-03-21 DIAGNOSIS — S60512A Abrasion of left hand, initial encounter: Secondary | ICD-10-CM | POA: Diagnosis not present
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
## 2018-03-22 ENCOUNTER — Emergency Department (HOSPITAL_COMMUNITY): Payer: Managed Care, Other (non HMO)

## 2018-03-22 MED ORDER — OXYCODONE HCL 5 MG PO TABS: 5.0000 mg | ORAL_TABLET | Freq: Four times a day (QID) | ORAL | 0 refills | Status: DC | PRN

## 2018-03-22 MED ORDER — OXYCODONE-ACETAMINOPHEN 5-325 MG PO TABS
2.0000 | ORAL_TABLET | Freq: Once | ORAL | Status: AC
Start: 2018-03-22 — End: 2018-03-22
  Administered 2018-03-22: 2 via ORAL
  Filled 2018-03-22: qty 2

## 2018-03-22 MED ORDER — METHOCARBAMOL 500 MG PO TABS
500.0000 mg | ORAL_TABLET | Freq: Once | ORAL | Status: AC
  Administered 2018-03-22: 500 mg via ORAL
  Filled 2018-03-22: qty 1

## 2018-03-22 MED ORDER — METHOCARBAMOL 500 MG PO TABS: 500.0000 mg | ORAL_TABLET | Freq: Two times a day (BID) | ORAL | 0 refills | Status: DC

## 2018-03-22 NOTE — ED Notes (Signed)
Pt to imaging

## 2018-03-22 NOTE — Discharge Instructions (Addendum)
1. Medications: tylenol (1000mg  4x/day - do not exceed 4g in 24 hours), roxicodone for breakthrough pain control, usual home medications 2. Treatment: rest, ice, elevate and use brace, drink plenty of fluids, gentle stretching 3. Follow Up: Please followup with orthopedics as directed for discussion of your diagnoses and further evaluation after today's visit; if you do not have a primary care doctor use the resource guide provided to find one; Please return to the ER for worsening symptoms or other concerns

## 2018-06-17 ENCOUNTER — Other Ambulatory Visit: Payer: Self-pay | Admitting: Gastroenterology

## 2018-12-28 ENCOUNTER — Other Ambulatory Visit: Payer: Self-pay | Admitting: Internal Medicine

## 2018-12-28 DIAGNOSIS — Z1231 Encounter for screening mammogram for malignant neoplasm of breast: Secondary | ICD-10-CM

## 2018-12-28 DIAGNOSIS — M810 Age-related osteoporosis without current pathological fracture: Secondary | ICD-10-CM

## 2019-03-10 ENCOUNTER — Ambulatory Visit
Admission: RE | Admit: 2019-03-10 | Discharge: 2019-03-10 | Disposition: A | Discharge status: Home / Self Care | Payer: Managed Care, Other (non HMO) | Source: Ambulatory Visit | Attending: Internal Medicine | Admitting: Internal Medicine

## 2019-03-10 ENCOUNTER — Other Ambulatory Visit: Payer: Self-pay

## 2019-03-10 DIAGNOSIS — M810 Age-related osteoporosis without current pathological fracture: Secondary | ICD-10-CM

## 2019-03-10 DIAGNOSIS — Z1231 Encounter for screening mammogram for malignant neoplasm of breast: Secondary | ICD-10-CM

## 2019-04-19 ENCOUNTER — Other Ambulatory Visit (HOSPITAL_COMMUNITY): Payer: Self-pay | Admitting: *Deleted

## 2019-04-20 ENCOUNTER — Other Ambulatory Visit: Payer: Self-pay

## 2019-04-20 ENCOUNTER — Ambulatory Visit (HOSPITAL_COMMUNITY)
Admission: RE | Admit: 2019-04-20 | Discharge: 2019-04-20 | Disposition: A | Discharge status: Home / Self Care | Payer: Managed Care, Other (non HMO) | Source: Ambulatory Visit | Attending: Internal Medicine | Admitting: Internal Medicine

## 2019-04-20 DIAGNOSIS — M810 Age-related osteoporosis without current pathological fracture: Secondary | ICD-10-CM | POA: Insufficient documentation

## 2019-04-20 MED ORDER — ZOLEDRONIC ACID 5 MG/100ML IV SOLN
5.0000 mg | Freq: Once | INTRAVENOUS | Status: AC
  Administered 2019-04-20: 10:00:00 5 mg via INTRAVENOUS

## 2019-04-20 MED ORDER — ZOLEDRONIC ACID 5 MG/100ML IV SOLN
INTRAVENOUS | Status: AC
  Administered 2019-04-20: 5 mg via INTRAVENOUS
  Filled 2019-04-20: qty 100

## 2019-05-20 ENCOUNTER — Encounter: Payer: Self-pay | Admitting: Cardiovascular Disease

## 2019-05-20 NOTE — Progress Notes (Signed)
  Faith [name] called tonight with an episode of CP/ Mid /left sternal Some radiation to left arm She took  [place] and tylenol.  I made a home visit . Cardiac exam was normal No chest wall pain No pleuretic cp No indigestion  ECG - 3 lead ( I, V1, V6)  with Kardia mobile showed no ST elevation or depression  She was beginning to feel better and does not want to go to the ER tonight She will call /text me in the am. If she has any recurrent cp she will call EMS We will work her into a slot I the next week or so.  Marland Kitchen    Mertie Moores, MD  05/20/2019 11:49 PM    [facility] [place],  [place], [place]  [address] Phone: [phone]; Fax: [phone]

## 2019-05-23 ENCOUNTER — Ambulatory Visit (INDEPENDENT_AMBULATORY_CARE_PROVIDER_SITE_OTHER): Payer: Managed Care, Other (non HMO) | Admitting: Cardiovascular Disease

## 2019-05-23 ENCOUNTER — Encounter: Payer: Self-pay | Admitting: Cardiovascular Disease

## 2019-05-23 ENCOUNTER — Other Ambulatory Visit: Payer: Self-pay

## 2019-05-23 DIAGNOSIS — R079 Chest pain, unspecified: Secondary | ICD-10-CM | POA: Insufficient documentation

## 2019-05-23 LAB — BASIC METABOLIC PANEL
BUN/Creatinine Ratio: 21 (ref 12–28)
BUN: 15 mg/dL (ref 8–27)
CO2: 24 mmol/L (ref 20–29)
Calcium: 9 mg/dL (ref 8.7–10.3)
Chloride: 105 mmol/L (ref 96–106)
Creatinine, Ser: 0.7 mg/dL (ref 0.57–1.00)
GFR calc Af Amer: 107 mL/min/{1.73_m2} (ref >59)
GFR calc non Af Amer: 93 mL/min/{1.73_m2} (ref >59)
Glucose: 78 mg/dL (ref 65–99)
Potassium: 4.4 mmol/L (ref 3.5–5.2)
Sodium: 140 mmol/L (ref 134–144)

## 2019-05-23 LAB — TROPONIN I: Troponin I: <0.01 ng/mL (ref 0.00–0.04)

## 2019-05-23 NOTE — Patient Instructions (Addendum)
Medication Instructions:  Your provider recommends that you continue on your current medications as directed. Please refer to the Current Medication list given to you today.    Labwork: TODAY: BMET, troponin  Testing/Procedures: Dr. Acie Fredrickson recommends you have a cardiac CT.  Follow-Up: Your provider recommends that you schedule a follow-up appointment in 3 months.   CARDIAC CT INFORMATION: Your cardiac CT will be scheduled at one of the below locations:   [facility] [address] [place], [place] [address] (336) [place] [address] [place], [place] [address] [phone]  If scheduled at [facility], please arrive at the [facility] main entrance of [facility] 30-45 minutes prior to test start time. Proceed to the [facility] Radiology Department (first floor) to check-in and test prep.  If scheduled at [facility], please arrive 15 mins early for check-in and test prep.  Please follow these instructions carefully (unless otherwise directed):  On the Night Before the Test: . Be sure to Drink plenty of water. . Do not consume any caffeinated/decaffeinated beverages or chocolate 12 hours prior to your test. . Do not take any antihistamines 12 hours prior to your test.  On the Day of the Test: . Drink plenty of water. Do not drink any water within one hour of the test. . Do not eat any food 4 hours prior to the test. . You may take your regular medications prior to the test.  . Please wear underwire-free bra if available      After the Test: . Drink plenty of water. . After receiving IV contrast, you may experience a mild flushed feeling. This is normal. . On occasion, you may experience a mild rash up to 24 hours after the test. This is not dangerous. If this occurs, you can take Benadryl 25 mg and increase your fluid intake. . If you experience  trouble breathing, this can be serious. If it is severe call 911 IMMEDIATELY. If it is mild, please call our office.  Once we have confirmed authorization from your insurance company, we will call you to set up a date and time for your test.   For non-scheduling related questions, please contact the cardiac imaging nurse navigator should you have any questions/concerns: Marchia Bond, RN Navigator Cardiac Imaging [facility] [phone] Office

## 2019-05-23 NOTE — Progress Notes (Signed)
Cardiology Office Note:    Date:  05/23/2019   ID:  Faith [name], DOB 09-18-1955, MRN [id number]  PCP:  Haywood Pao, MD  Cardiologist:  Jatasia Gundrum  Electrophysiologist:  None   Referring MD: Haywood Pao, MD   Chief Complaint  Patient presents with  . Chest Pain    History of Present Illness:    Faith [name] is a 64 y.o. female with a hx of supraventricular tachycardia.  I have seen her many years ago.  I saw her several months ago as a home visit when she called complaining of chest pain.  She had been having chest pain primarily centered under her left breast.  There was some left shoulder  discomfort as well.   Radiating to back - . Lasted 1-2 hours.  Then gradually resolved.  Kardia tele did nto reveal any ST changed.  The following day she had right-sided chest discomfort.  Pain there is at this pain lasted all day long. Had some dyspnea but no pleuretic cp   Lipids in June, 2020 showed chol of 225, LDL was 138.  Trigs were 55, HDL was 76      Past Medical History:  Diagnosis Date  . Allergy    spring   . Anemia    yrs ago   . Arthritis   . Family history of colon cancer in father   . Gastric ulcer   . GERD (gastroesophageal reflux disease)   . Hypertension   . MVP (mitral valve prolapse)   . Osteopenia     Past Surgical History:  Procedure Laterality Date  . BREAST EXCISIONAL BIOPSY Right 1986  . BREAST REDUCTION SURGERY    . CHOLECYSTECTOMY    . COLONOSCOPY    . GASTRIC BYPASS    . KNEE ARTHROSCOPY     right  . REDUCTION MAMMAPLASTY Bilateral 2001  . TEMPOROMANDIBULAR JOINT SURGERY    . TONSILLECTOMY AND ADENOIDECTOMY    . TUBAL LIGATION    . UPPER GASTROINTESTINAL ENDOSCOPY      Current Medications: Current Meds  Medication Sig  . AUVI-Q 0.3 MG/0.3ML SOAJ injection Inject 0.3 mg into the muscle as needed.  Marland Kitchen CALCIUM PO Take 1 tablet by mouth daily. Adult chewable calcium supplement   . ibuprofen (ADVIL,MOTRIN) 200 MG tablet Take  600 mg by mouth every 6 (six) hours as needed. For pain   . levocetirizine (XYZAL) 5 MG tablet Take 1 tablet by mouth daily.  Marland Kitchen lisinopril (PRINIVIL,ZESTRIL) 20 MG tablet Take 20 mg by mouth.  . Multiple Vitamin (MULITIVITAMIN WITH MINERALS) TABS Take 3 tablets by mouth daily. Adult gummy multi-vitamins   . Probiotic Product (PROBIOTIC DAILY PO) Take 1 capsule daily by mouth.     Allergies:   Patient has no known allergies.   Social History   Socioeconomic History  . Marital status: Married    Spouse name: Not on file  . Number of children: 2  . Years of education: Not on file  . Highest education level: Not on file  Occupational History  . Occupation: Mental Health Therapist  Tobacco Use  . Smoking status: Never Smoker  . Smokeless tobacco: Never Used  Substance and Sexual Activity  . Alcohol use: Yes    Alcohol/week: 0.0 standard drinks    Comment: rare  . Drug use: No  . Sexual activity: Not on file  Other Topics Concern  . Not on file  Social History Narrative  . Not on file  Social Determinants of Health   Financial Resource Strain:   . Difficulty of Paying Living Expenses: Not on file  Food Insecurity:   . Worried About Charity fundraiser in the Last Year: Not on file  . Ran Out of Food in the Last Year: Not on file  Transportation Needs:   . Lack of Transportation (Medical): Not on file  . Lack of Transportation (Non-Medical): Not on file  Physical Activity:   . Days of Exercise per Week: Not on file  . Minutes of Exercise per Session: Not on file  Stress:   . Feeling of Stress : Not on file  Social Connections:   . Frequency of Communication with Friends and Family: Not on file  . Frequency of Social Gatherings with Friends and Family: Not on file  . Attends Religious Services: Not on file  . Active Member of Clubs or Organizations: Not on file  . Attends Archivist Meetings: Not on file  . Marital Status: Not on file     Family  History: The patient's family history includes Colon cancer (age of onset: 41) in her father. She was adopted.  ROS:   Please see the history of present illness.     All other systems reviewed and are negative.  EKGs/Labs/Other Studies Reviewed:    The following studies were reviewed today:   EKG:   Jan. 5, 2021.   Sinus brady at 50 . No ST or T wave changes.   Recent Labs: No results found for requested labs within last 8760 hours.  Recent Lipid Panel No results found for: CHOL, TRIG, HDL, CHOLHDL, VLDL, LDLCALC, LDLDIRECT  Physical Exam:    VS:  BP 124/82   Pulse (!) 50   Ht 5\' 3"  (1.6 m)   Wt 166 lb (75.3 kg)   SpO2 96%   BMI 29.41 kg/m     Wt Readings from Last 3 Encounters:  05/23/19 166 lb (75.3 kg)  03/21/18 172 lb (78 kg)  10/27/17 172 lb 2 oz (78.1 kg)     GEN:  Well nourished, well developed in no acute distress HEENT: Normal NECK: No JVD; No carotid bruits LYMPHATICS: No lymphadenopathy CARDIAC: RRR, no murmurs, rubs, gallops,  Right rib tenderness  RESPIRATORY:  Clear to auscultation without rales, wheezing or rhonchi  ABDOMEN: Soft, non-tender, non-distended MUSCULOSKELETAL:  No edema; No deformity  SKIN: Warm and dry NEUROLOGIC:  Alert and oriented x 3 PSYCHIATRIC:  Normal affect   ASSESSMENT:    No diagnosis found. PLAN:    In order of problems listed above:  1. Chest pain : Deneise Lever  had some moderately severe chest pain several nights ago.  These pains lasted for an hour or so.  There was no real rib tenderness at that time.  She does have some right-sided rib tenderness today that may be costochondritis.  There is no rash to suggest shingles.  She has mildly elevated cholesterol levels with an LDL of 138.  I think we should proceed with coronary CT angiogram for further evaluation.     I recommended that she take ibuprofen 400 mg 3 times a day for the next 4 to 5 days to help with this costochondritis pain.  She will call us back if she has any  recurrent episodes of chest discomfort.  I will plan on seeing her again in 3 months for follow-up visit.  Medication Adjustments/Labs and Tests Ordered: Current medicines are reviewed at length with the patient today.  Concerns regarding medicines are outlined above.  No orders of the defined types were placed in this encounter.  No orders of the defined types were placed in this encounter.   There are no Patient Instructions on file for this visit.   Signed, Mertie Moores, MD  05/23/2019 9:16 AM    [place]

## 2019-06-27 ENCOUNTER — Telehealth (HOSPITAL_COMMUNITY): Payer: Self-pay | Admitting: Emergency Medicine

## 2019-06-27 NOTE — Telephone Encounter (Signed)
Reaching out to patient to offer assistance regarding upcoming cardiac imaging study; pt verbalizes understanding of appt date/time, parking situation and where to check in, pre-test NPO status and medications ordered, and verified current allergies; name and call back number provided for further questions should they arise Kenneshia Rehm RN Navigator Cardiac Imaging [facility] [phone] office [phone] cell 

## 2019-06-28 ENCOUNTER — Ambulatory Visit (HOSPITAL_COMMUNITY)
Admission: RE | Admit: 2019-06-28 | Discharge: 2019-06-28 | Disposition: A | Discharge status: Home / Self Care | Payer: Managed Care, Other (non HMO) | Source: Ambulatory Visit | Attending: Cardiovascular Disease | Admitting: Cardiovascular Disease

## 2019-06-28 ENCOUNTER — Other Ambulatory Visit: Payer: Self-pay

## 2019-06-28 ENCOUNTER — Encounter: Payer: Managed Care, Other (non HMO) | Admitting: *Deleted

## 2019-06-28 DIAGNOSIS — R079 Chest pain, unspecified: Secondary | ICD-10-CM | POA: Diagnosis not present

## 2019-06-28 DIAGNOSIS — Z006 Encounter for examination for normal comparison and control in clinical research program: Secondary | ICD-10-CM

## 2019-06-28 MED ORDER — IOHEXOL 350 MG/ML SOLN
80.0000 mL | Freq: Once | INTRAVENOUS | Status: AC | PRN
  Administered 2019-06-28: 80 mL via INTRAVENOUS

## 2019-06-28 MED ORDER — NITROGLYCERIN 0.4 MG SL SUBL
0.8000 mg | SUBLINGUAL_TABLET | Freq: Once | SUBLINGUAL | Status: AC
  Administered 2019-06-28: 0.8 mg via SUBLINGUAL

## 2019-06-28 MED ORDER — NITROGLYCERIN 0.4 MG SL SUBL
SUBLINGUAL_TABLET | SUBLINGUAL | Status: AC
  Filled 2019-06-28: qty 2

## 2019-06-28 NOTE — Research (Signed)
CADFEM Informed Consent                  Subject Name:   Trent A. Gruhn   Subject met inclusion and exclusion criteria.  The informed consent form, study requirements and expectations were reviewed with the subject and questions and concerns were addressed prior to the signing of the consent form.  The subject verbalized understanding of the trial requirements.  The subject agreed to participate in the CADFEM trial and signed the informed consent.  The informed consent was obtained prior to performance of any protocol-specific procedures for the subject.  A copy of the signed informed consent was given to the subject and a copy was placed in the subject's medical record.   [place] Penny Frisbie, Research Assistant  06/28/2019  08:47 a.m.

## 2019-06-30 ENCOUNTER — Telehealth: Payer: Self-pay | Admitting: Nurse Practitioner

## 2019-06-30 DIAGNOSIS — E785 Hyperlipidemia, unspecified: Secondary | ICD-10-CM

## 2019-06-30 DIAGNOSIS — I2510 Atherosclerotic heart disease of native coronary artery without angina pectoris: Secondary | ICD-10-CM

## 2019-06-30 MED ORDER — ROSUVASTATIN CALCIUM 10 MG PO TABS: 10.0000 mg | ORAL_TABLET | Freq: Every day | ORAL | 3 refills | Status: DC

## 2019-06-30 NOTE — Telephone Encounter (Signed)
Results and plan of care reviewed with patient who verbalized understanding. She agrees to start rosuvastatin 10 mg daily. She is scheduled for lab appointment and follow-up with Dr. Acie Fredrickson in May. I advised her to call back prior to that time with questions or concerns. She thanked me for the call.

## 2019-06-30 NOTE — Telephone Encounter (Signed)
-----   Message from Thayer Headings, MD sent at 06/29/2019 12:21 PM EST ----- Coronary calcium score of 19. This was 17 percentile for age and sex matched control. Mild plaque in the prox LAD and moderate plaque in the 2nd diag.  So this does not explain her symptoms of CP . She does need to be aggressive with her lipid management.  Lipids from primary MD in June, 2020 showed an LDL of 138.   She needs to have an LDL of < 70 Lets start Rosuvastatin 10 mg a day .   Recheck lipids, liver enz and bmp in 3 months

## 2019-08-10 ENCOUNTER — Ambulatory Visit: Payer: Managed Care, Other (non HMO) | Admitting: Cardiovascular Disease

## 2019-09-01 ENCOUNTER — Other Ambulatory Visit (INDEPENDENT_AMBULATORY_CARE_PROVIDER_SITE_OTHER): Payer: Managed Care, Other (non HMO)

## 2019-09-01 ENCOUNTER — Telehealth: Payer: Self-pay | Admitting: Gastroenterology

## 2019-09-01 ENCOUNTER — Other Ambulatory Visit: Payer: Self-pay

## 2019-09-01 DIAGNOSIS — R1084 Generalized abdominal pain: Secondary | ICD-10-CM

## 2019-09-01 LAB — AMYLASE: Amylase: 63 U/L (ref 27–131)

## 2019-09-01 LAB — COMPREHENSIVE METABOLIC PANEL
ALT: 14 U/L (ref 0–35)
AST: 17 U/L (ref 0–37)
Albumin: 4.4 g/dL (ref 3.5–5.2)
Alkaline Phosphatase: 42 U/L (ref 39–117)
BUN: 11 mg/dL (ref 6–23)
CO2: 30 mEq/L (ref 19–32)
Calcium: 9.4 mg/dL (ref 8.4–10.5)
Chloride: 108 mEq/L (ref 96–112)
Creatinine, Ser: 0.63 mg/dL (ref 0.40–1.20)
GFR: 95.66 mL/min (ref >60.00)
Glucose, Bld: 88 mg/dL (ref 70–99)
Potassium: 4.2 mEq/L (ref 3.5–5.1)
Sodium: 144 mEq/L (ref 135–145)
Total Bilirubin: 0.6 mg/dL (ref 0.2–1.2)
Total Protein: 6.5 g/dL (ref 6.0–8.3)

## 2019-09-01 LAB — CBC WITH DIFFERENTIAL/PLATELET
Basophils Absolute: 0 10*3/uL (ref 0.0–0.1)
Basophils Relative: 0.3 % (ref 0.0–3.0)
Eosinophils Absolute: 0.1 10*3/uL (ref 0.0–0.7)
Eosinophils Relative: 0.8 % (ref 0.0–5.0)
HCT: 41.5 % (ref 36.0–46.0)
Hemoglobin: 13.7 g/dL (ref 12.0–15.0)
Lymphocytes Relative: 28.8 % (ref 12.0–46.0)
Lymphs Abs: 1.9 10*3/uL (ref 0.7–4.0)
MCHC: 33.1 g/dL (ref 30.0–36.0)
MCV: 95.8 fl (ref 78.0–100.0)
Monocytes Absolute: 0.4 10*3/uL (ref 0.1–1.0)
Monocytes Relative: 5.3 % (ref 3.0–12.0)
Neutro Abs: 4.3 10*3/uL (ref 1.4–7.7)
Neutrophils Relative %: 64.8 % (ref 43.0–77.0)
Platelets: 196 10*3/uL (ref 150.0–400.0)
RBC: 4.33 Mil/uL (ref 3.87–5.11)
RDW: 14.3 % (ref 11.5–15.5)
WBC: 6.7 10*3/uL (ref 4.0–10.5)

## 2019-09-01 LAB — LIPASE: Lipase: 62 U/L — ABNORMAL HIGH (ref 11.0–59.0)

## 2019-09-01 MED ORDER — SUCRALFATE 1 GM/10ML PO SUSP: 1.0000 g | Freq: Three times a day (TID) | ORAL | 2 refills | Status: DC

## 2019-09-01 NOTE — Telephone Encounter (Signed)
Spoke with the patient. She has complaints of severe abdominal pain described as a constant "severe ache" in the middle of the abdomen radiating into her back. It awoke her this morning at 2 am. She states " I cannot go through the weekend" with this pain. Denies fever, constipation or diarrhea. No nausea or vomiting.  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  She has not been on any PPI or H2 blockers. She has Omeprazole 40 mg available and will take on now. There are no openings on the APP schedule until next week. Please advise.

## 2019-09-01 NOTE — Telephone Encounter (Signed)
Please send Rx for Carafate 1gm before meals and at bedtime X 30 days.  Check CBC, CMP, amylase and lipase. If continues to have persistent abdominal pain , will need to come to ER for further work up and pain control.

## 2019-09-01 NOTE — Telephone Encounter (Signed)
Spoke with the patient about the plan of care. She agrees to this. Pharmacy confirmed. Labs today. Appointment scheduled for follow up Tuesday 09/05/19.  Understands if her pain persists at this level of intensity or worsen, it is justified that she go to the ED.

## 2019-09-04 ENCOUNTER — Other Ambulatory Visit: Payer: Self-pay

## 2019-09-04 ENCOUNTER — Other Ambulatory Visit (INDEPENDENT_AMBULATORY_CARE_PROVIDER_SITE_OTHER): Payer: Managed Care, Other (non HMO)

## 2019-09-04 DIAGNOSIS — K859 Acute pancreatitis without necrosis or infection, unspecified: Secondary | ICD-10-CM

## 2019-09-04 LAB — COMPREHENSIVE METABOLIC PANEL
ALT: 14 U/L (ref 0–35)
AST: 17 U/L (ref 0–37)
Albumin: 4.4 g/dL (ref 3.5–5.2)
Alkaline Phosphatase: 40 U/L (ref 39–117)
BUN: 16 mg/dL (ref 6–23)
CO2: 31 mEq/L (ref 19–32)
Calcium: 9.5 mg/dL (ref 8.4–10.5)
Chloride: 106 mEq/L (ref 96–112)
Creatinine, Ser: 0.59 mg/dL (ref 0.40–1.20)
GFR: 103.18 mL/min (ref >60.00)
Glucose, Bld: 81 mg/dL (ref 70–99)
Potassium: 4 mEq/L (ref 3.5–5.1)
Sodium: 141 mEq/L (ref 135–145)
Total Bilirubin: 0.5 mg/dL (ref 0.2–1.2)
Total Protein: 6.4 g/dL (ref 6.0–8.3)

## 2019-09-04 LAB — CBC WITH DIFFERENTIAL/PLATELET
Basophils Absolute: 0 10*3/uL (ref 0.0–0.1)
Basophils Relative: 0.6 % (ref 0.0–3.0)
Eosinophils Absolute: 0.1 10*3/uL (ref 0.0–0.7)
Eosinophils Relative: 1.4 % (ref 0.0–5.0)
HCT: 39.4 % (ref 36.0–46.0)
Hemoglobin: 13.1 g/dL (ref 12.0–15.0)
Lymphocytes Relative: 37.3 % (ref 12.0–46.0)
Lymphs Abs: 2.3 10*3/uL (ref 0.7–4.0)
MCHC: 33.3 g/dL (ref 30.0–36.0)
MCV: 95.8 fl (ref 78.0–100.0)
Monocytes Absolute: 0.5 10*3/uL (ref 0.1–1.0)
Monocytes Relative: 7.4 % (ref 3.0–12.0)
Neutro Abs: 3.4 10*3/uL (ref 1.4–7.7)
Neutrophils Relative %: 53.3 % (ref 43.0–77.0)
Platelets: 209 10*3/uL (ref 150.0–400.0)
RBC: 4.12 Mil/uL (ref 3.87–5.11)
RDW: 14.5 % (ref 11.5–15.5)
WBC: 6.3 10*3/uL (ref 4.0–10.5)

## 2019-09-04 LAB — LIPASE: Lipase: 70 U/L — ABNORMAL HIGH (ref 11.0–59.0)

## 2019-09-04 NOTE — Telephone Encounter (Signed)
Please recheck her labs today CBC, CMP and lipase. Will plan to schedule CT abd & pelvis soon when she comes in for office visit. Thanks

## 2019-09-04 NOTE — Telephone Encounter (Signed)
Spoke with the patient. She did not go to the Ed over the weekend. She reports she is "better" though not pain free yet. She has an aching of her abdomen now. States she mostly feels weak. Is there anything she should be doing at this point? She is scheduled to see [facility] tomorrow. Thanks

## 2019-09-05 ENCOUNTER — Ambulatory Visit (INDEPENDENT_AMBULATORY_CARE_PROVIDER_SITE_OTHER): Payer: Managed Care, Other (non HMO) | Admitting: Nurse Practitioner

## 2019-09-05 ENCOUNTER — Encounter: Payer: Self-pay | Admitting: Nurse Practitioner

## 2019-09-05 VITALS — BP 132/88 | HR 60 | Temp 98.5°F | Ht 63.0 in | Wt 157.4 lb

## 2019-09-05 DIAGNOSIS — R748 Abnormal levels of other serum enzymes: Secondary | ICD-10-CM | POA: Diagnosis not present

## 2019-09-05 DIAGNOSIS — R1084 Generalized abdominal pain: Secondary | ICD-10-CM | POA: Insufficient documentation

## 2019-09-05 DIAGNOSIS — R0989 Other specified symptoms and signs involving the circulatory and respiratory systems: Secondary | ICD-10-CM | POA: Diagnosis not present

## 2019-09-05 DIAGNOSIS — R1033 Periumbilical pain: Secondary | ICD-10-CM

## 2019-09-05 MED ORDER — DICYCLOMINE HCL 10 MG PO CAPS: 10.0000 mg | ORAL_CAPSULE | Freq: Three times a day (TID) | ORAL | 0 refills | Status: DC | PRN

## 2019-09-05 NOTE — Patient Instructions (Addendum)
If you are age 63 or older, your body mass index should be between 23-30. Your Body mass index is 27.88 kg/m. If this is out of the aforementioned range listed, please consider follow up with your Primary Care Provider.  If you are age 55 or younger, your body mass index should be between 19-25. Your Body mass index is 27.88 kg/m. If this is out of the aformentioned range listed, please consider follow up with your Primary Care Provider.    We have sent the following medications to your pharmacy for you to pick up at your convenience:  Dicyclomine 1 tablet every 8 hours as needed for abdominal pain.  Use Miralax at bedtime for constipation- 1 capful in 8 ounces of water.  Stay on a bland diet and avoid fatty foods. Be sure and push fluids.  You have been scheduled for a CT scan of the abdomen and pelvis a [place] You are scheduled on 09/11/2019 at 8:00 am. You should arrive 15 minutes prior to your appointment time for registration. Please follow the written instructions below on the day of your exam:  WARNING: IF YOU ARE ALLERGIC TO IODINE/X-RAY DYE, PLEASE NOTIFY RADIOLOGY IMMEDIATELY AT [phone]! YOU WILL BE GIVEN A 13 HOUR PREMEDICATION PREP.  1) Do not eat or drink anything after 4:00 am (4 hours prior to your test) 2) You have been given 2 bottles of oral contrast to drink. The solution may taste better if refrigerated, but do NOT add ice or any other liquid to this solution. Shake well before drinking.    Drink 1 bottle of contrast @ 6:00am (2 hours prior to your exam)  Drink 1 bottle of contrast @ 7:00 am (1 hour prior to your exam)  You may take any medications as prescribed with a small amount of water, if necessary. If you take any of the following medications: METFORMIN, GLUCOPHAGE, GLUCOVANCE, AVANDAMET, RIOMET, FORTAMET, [place] MET, JANUMET, GLUMETZA or METAGLIP, you MAY be asked to HOLD this medication 48 hours AFTER the exam.  The purpose of  you drinking the oral contrast is to aid in the visualization of your intestinal tract. The contrast solution may cause some diarrhea. Depending on your individual set of symptoms, you may also receive an intravenous injection of x-ray contrast/dye. Plan on being at [facility] for 30 minutes or longer, depending on the type of exam you are having performed.  This test typically takes 30-45 minutes to complete.  If you have any questions regarding your exam or if you need to reschedule, you may call the CT department at [phone] between the hours of 8:00 am and 5:00 pm, Monday-Friday.  ________________________________________________________________________       ________________________________________________________________________

## 2019-09-05 NOTE — Progress Notes (Signed)
Reviewed and agree with documentation and assessment and plan. K. Veena Saathvik Every , MD   

## 2019-09-05 NOTE — Progress Notes (Signed)
09/05/2019 SLOKA HUDSON [id number] [birth year]   Chief Complaint:  Abdominal pain   History of Present Illness:  Leihla Desouza is a 64 year old female with a past medical history of arthritis, hypertension, right liver lesion ([place]),  GERD, ? history of a gastrojejunal anastomoatic ulcer and osteopenia. 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 She presents today for further evaluation regarding generalized abdominal pain with associated upper and lower back pain. She developed central abdominal pain at 2am on Friday 4/16 which awakened her from sleep. No associated nausea or vomiting. She described her pain as dull which progressively worsened with intermittent shooting pains throughout the day. Her abdominal pain continued. She contacted Dr. Silverio Decamp and she was advised to take Omeprazole 40mg  once daily, to take Carafate 1 gm po qid x 30 days and labs including a CBC, CMP and lipase levels were ordered. Her lab results showed mildly elevated lipase of 62, normal LFTs and CBC.  She was instructed to go to the ED if her abdominal pain worsened. She called Dr. Silverio Decamp on 4/19 with reports of feeling better with a continued ache to her central abdomen and generalized weakness. Repeat labs were done which showed Lipase 70. LFTs were normal. WBC 6.3.  Hg 13.1. HCT 39.4. Currently, she continues to have central abdominal pain, dull, no severe pain. No nausea or vomiting. No significant alcohol intake, she drinks 4 mixed drinks monthly. She was started on rosuvastatin 10mg  daily about 2 months ago as prescribed by her cardiologist.  She is taking "Probioslilm" probiotic 2 capsules daily for the past month. No NSAID use. She is passing a normal soft brown BM every other day. She occasionally sees a scant amount of light red blood on the TP when wiping, occurs every other week. No melena. She is urinating normally. No weight loss. No fever, sweats or chills. Father with history of  colon cancer. Her most recent EGD and colonoscopy were done 04/02/2017, see results below.   EGD 04/02/2017: - Normal esophagus. - Roux-en-Y gastrojejunostomy with gastrojejunal anastomosis characterized by healthy appearing mucosa.  - Normal examined jejunum.  Colonoscopy 04/02/2017:  - One 4 mm polyp in the ascending colon, removed with a cold snare. Resected and retrieved. - Two 1 to 2 mm polyps in the sigmoid colon and in the cecum, removed with a cold biopsy forceps. - Non-bleeding internal hemorrhoids.  -5 year recall.  Biopsies:  1. Surgical [P], ascending and cecum, polyp (2) - TUBULAR ADENOMA(S). - HIGH GRADE DYSPLASIA IS NOT IDENTIFIED. 2. Surgical [P], sigmoid, polyp - HYPERPLASTIC POLYP(S). - THERE IS NO EVIDENCE OF MALIGNANCY  Abdominal MRI 02/16/2017: 1. Hypervascular 2.4 cm inferior right liver lobe mass is stable since 04/01/2016 CT abdomen study and demonstrates MRI features compatible with benign focal nodular hyperplasia ([place]). 2. Left adrenal adenoma. 3. No acute abnormality  CBC Latest Ref Rng & Units 09/04/2019 09/01/2019 08/14/2008  WBC 4.0 - 10.5 K/uL 6.3 6.7 4.9  Hemoglobin 12.0 - 15.0 g/dL 13.1 13.7 11.9(L)  Hematocrit 36.0 - 46.0 % 39.4 41.5 35.4(L)  Platelets 150.0 - 400.0 K/uL 209.0 196.0 164   CMP Latest Ref Rng & Units 09/04/2019 09/01/2019 05/23/2019  Glucose 70 - 99 mg/dL 81 88 78  BUN 6 - 23 mg/dL 16 11 15   Creatinine 0.40 - 1.20 mg/dL 0.59 0.63 0.70  Sodium 135 - 145 mEq/L 141 144 140  Potassium 3.5 - 5.1 mEq/L 4.0 4.2 4.4  Chloride 96 - 112  mEq/L 106 108 105  CO2 19 - 32 mEq/L 31 30 24   Calcium 8.4 - 10.5 mg/dL 9.5 9.4 9.0  Total Protein 6.0 - 8.3 g/dL 6.4 6.5 -  Total Bilirubin 0.2 - 1.2 mg/dL 0.5 0.6 -  Alkaline Phos 39 - 117 U/L 40 42 -  AST 0 - 37 U/L 17 17 -  ALT 0 - 35 U/L 14 14 -   Current Outpatient Medications on File Prior to Visit  Medication Sig Dispense Refill  . AMBULATORY NON FORMULARY MEDICATION Probioslim Extra  Strength Probiotic, fat burner and bloat Tae 2 tablet by mouth daily    . AUVI-Q 0.3 MG/0.3ML SOAJ injection Inject 0.3 mg into the muscle as needed.    [place] CALCIUM PO Take 1 tablet by mouth daily. Adult chewable calcium supplement     . ibuprofen (ADVIL,MOTRIN) 200 MG tablet Take 600 mg by mouth every 6 (six) hours as needed. For pain     . levocetirizine (XYZAL) 5 MG tablet Take 1 tablet by mouth daily.  3  . lisinopril (PRINIVIL,ZESTRIL) 20 MG tablet Take 20 mg by mouth.    . Multiple Vitamin (MULITIVITAMIN WITH MINERALS) TABS Take 3 tablets by mouth daily. Adult gummy multi-vitamins     . Probiotic Product (PROBIOTIC DAILY PO) Take 1 capsule daily by mouth.    . rosuvastatin (CRESTOR) 10 MG tablet Take 1 tablet (10 mg total) by mouth daily. 90 tablet 3  . sucralfate (CARAFATE) 1 GM/10ML suspension Take 10 mLs (1 g total) by mouth 4 (four) times daily -  with meals and at bedtime. 420 mL 2   No current facility-administered medications on file prior to visit.    No Known Allergies   Current Medications, Allergies, Past Medical History, Past Surgical History, Family History and Social History were reviewed in [employer] record.   Physical Exam: Temp 98.5 F (36.9 C)   Ht 5\' 3"  (1.6 m)   Wt 157 lb 6 oz (71.4 kg)   BMI 27.88 kg/m  General: Well developed 64 year old female in no acute distress. Head: Normocephalic and atraumatic. Eyes: No scleral icterus. Conjunctiva pink . Ears: Normal auditory acuity. Lungs: Clear throughout to auscultation. Heart: Regular rate and rhythm, no murmur. Abdomen: Soft, nondistended. Mild periumbilical tenderness without rebound or guarding.  Soft bruit auscultated  3 to 4 cm above the umbilicus. Normoactive bowel sounds x 4 quads. No mass or organomegaly. No CVA tenderness.  Rectal: Deferred.  Musculoskeletal: Symmetrical with no gross deformities. Extremities: No edema. Neurological: Alert oriented x 4. No focal deficits.   Psychological: Alert and cooperative. Normal mood and affect  Assessment and Recommendations:  26. 64 year old female with periumbilical pain with associated upper and lower back pain without N/V. 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  Mildly elevated lipase 62 -> 70. Normal LFTs and CBC.  Soft supraumbilical bruit on exam.  -Abd/pelvic CT with contrast -Continue Omeprazole and Carafate for now -Push fluids, bland diet, avoid fatty foods -Dicyclomine 10mg  po Q 8hrs PRN -Patient to call our office if her symptoms worsen, to the ED if severe abdominal pain develops   2. Constipation -Miralax PRN  3. History of colon polpys -Next colonoscopy is due 03/2022  4. 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

## 2019-09-11 ENCOUNTER — Ambulatory Visit (HOSPITAL_COMMUNITY): Payer: Managed Care, Other (non HMO)

## 2019-09-12 ENCOUNTER — Telehealth: Payer: Self-pay | Admitting: Nurse Practitioner

## 2019-09-12 NOTE — Telephone Encounter (Signed)
Contacted Faith [name] to advise her I would change her appointment to [facility] for her CT. Patient advised me not to change it at this time, she will call me back after hearing back from [facility] to see if they are able to get her in as soon as this week. Faith [name] uses [facility] as their preferred place.

## 2019-09-13 NOTE — Telephone Encounter (Signed)
CT order faxed to [facility] at [place]  Fax [phone]

## 2019-09-14 ENCOUNTER — Ambulatory Visit (HOSPITAL_COMMUNITY): Payer: Managed Care, Other (non HMO)

## 2019-09-20 ENCOUNTER — Telehealth: Payer: Self-pay | Admitting: Cardiovascular Disease

## 2019-09-20 NOTE — Telephone Encounter (Signed)
Pt c/o medication issue:  1. Name of Medication: rosuvastatin (CRESTOR) 10 MG tablet  2. How are you currently taking this medication (dosage and times per day)? 1 tablet once a day  3. Are you having a reaction (difficulty breathing--STAT)? no  4. What is your medication issue? Patient states she has pancreatitis and is wondering if the crestor could contribute to it. She would also like to know if she should continue taking the medication now that she has to be on an extremely low fat diet. She states she spoke with her gastroenterologist who stated it is rare, but not impossible for the crestor to contribute to pancreatitis.

## 2019-09-20 NOTE — Telephone Encounter (Signed)
Her rosuvastatin should not have contributed to her pancreatitis. This would be very rare - there are only a few case studies of possible correlation with rosuvastatin-induced pancreatitis.  She should continue her statin due to her elevated calcium score that noted plaque in her coronary arteries. Her LDL goal is < 70.

## 2019-09-20 NOTE — Telephone Encounter (Signed)
Left message for patient to call back  

## 2019-09-20 NOTE — Telephone Encounter (Signed)
Patient returning call.

## 2019-09-21 ENCOUNTER — Telehealth: Payer: Self-pay | Admitting: Cardiovascular Disease

## 2019-09-21 NOTE — Telephone Encounter (Signed)
Attempted to call patient, no answer and voice mail is full.

## 2019-09-21 NOTE — Telephone Encounter (Signed)
   Went to chart to check who called pt, transferred call to [facility]

## 2019-09-21 NOTE — Telephone Encounter (Signed)
Will review labs tomorrow and come up with a decision about PCSK9 inhibitors

## 2019-09-21 NOTE — Telephone Encounter (Signed)
Received return call from patient who called to discuss the advice regarding pancreatitis. I reviewed advice from [place], [facility] with patient who verbalized understanding. She states she understands that pancreatitis may be rare with rosuvastatin but that is the only change in her medical history prior to developing it. I reviewed the results of her CT and the coronary calcifications and LDL goal of < 70. She requests that we move lab appointment to tomorrow so that Dr. Acie Fredrickson and Sherian Rein D can review and advise regarding medication management because she is interested in alternative treatments for cholesterol. I moved her lab appointment from 5/20 to tomorrow and advised we will call back when results are available. She verbalized agreement with plan and thanked me for my help.

## 2019-09-22 ENCOUNTER — Telehealth: Payer: Self-pay | Admitting: Gastroenterology

## 2019-09-22 ENCOUNTER — Other Ambulatory Visit: Payer: Self-pay

## 2019-09-22 ENCOUNTER — Other Ambulatory Visit: Payer: Managed Care, Other (non HMO) | Admitting: *Deleted

## 2019-09-22 ENCOUNTER — Ambulatory Visit (HOSPITAL_COMMUNITY)
Admission: RE | Admit: 2019-09-22 | Discharge: 2019-09-22 | Disposition: A | Discharge status: Home / Self Care | Payer: Managed Care, Other (non HMO) | Source: Ambulatory Visit | Attending: Nurse Practitioner | Admitting: Nurse Practitioner

## 2019-09-22 DIAGNOSIS — R748 Abnormal levels of other serum enzymes: Secondary | ICD-10-CM | POA: Diagnosis present

## 2019-09-22 DIAGNOSIS — I2510 Atherosclerotic heart disease of native coronary artery without angina pectoris: Secondary | ICD-10-CM

## 2019-09-22 DIAGNOSIS — R0989 Other specified symptoms and signs involving the circulatory and respiratory systems: Secondary | ICD-10-CM | POA: Diagnosis not present

## 2019-09-22 DIAGNOSIS — R1084 Generalized abdominal pain: Secondary | ICD-10-CM | POA: Diagnosis present

## 2019-09-22 DIAGNOSIS — E785 Hyperlipidemia, unspecified: Secondary | ICD-10-CM

## 2019-09-22 LAB — LIPID PANEL
Chol/HDL Ratio: 1.7 ratio (ref 0.0–4.4)
Cholesterol, Total: 142 mg/dL (ref 100–199)
HDL: 83 mg/dL (ref >39)
LDL Chol Calc (NIH): 47 mg/dL (ref 0–99)
Triglycerides: 58 mg/dL (ref 0–149)
VLDL Cholesterol Cal: 12 mg/dL (ref 5–40)

## 2019-09-22 LAB — BASIC METABOLIC PANEL
BUN/Creatinine Ratio: 17 (ref 12–28)
BUN: 10 mg/dL (ref 8–27)
CO2: 23 mmol/L (ref 20–29)
Calcium: 9.4 mg/dL (ref 8.7–10.3)
Chloride: 105 mmol/L (ref 96–106)
Creatinine, Ser: 0.6 mg/dL (ref 0.57–1.00)
GFR calc Af Amer: 113 mL/min/{1.73_m2} (ref >59)
GFR calc non Af Amer: 98 mL/min/{1.73_m2} (ref >59)
Glucose: 72 mg/dL (ref 65–99)
Potassium: 4 mmol/L (ref 3.5–5.2)
Sodium: 143 mmol/L (ref 134–144)

## 2019-09-22 LAB — HEPATIC FUNCTION PANEL
ALT: 16 IU/L (ref 0–32)
AST: 20 IU/L (ref 0–40)
Albumin: 4.5 g/dL (ref 3.8–4.8)
Alkaline Phosphatase: 47 IU/L (ref 39–117)
Bilirubin Total: 0.6 mg/dL (ref 0.0–1.2)
Bilirubin, Direct: 0.17 mg/dL (ref 0.00–0.40)
Total Protein: 6.4 g/dL (ref 6.0–8.5)

## 2019-09-22 MED ORDER — IOHEXOL 300 MG/ML  SOLN
100.0000 mL | Freq: Once | INTRAMUSCULAR | Status: AC | PRN
  Administered 2019-09-22: 100 mL via INTRAVENOUS

## 2019-09-22 MED ORDER — SODIUM CHLORIDE (PF) 0.9 % IJ SOLN
INTRAMUSCULAR | Status: AC
  Filled 2019-09-22: qty 50

## 2019-09-22 NOTE — Telephone Encounter (Signed)
Very nice patient who is very anxious to get her imaging results. The delay in being able to get the CT (due to insurance) frustrated her and she is really wanting to your interpretations and recommendations. Thank you.

## 2019-09-22 NOTE — Telephone Encounter (Signed)
I called Faith [name], I discussed her right liver lesion has not changed since her Abd MRI done in 2018. However, I will forward the MRI report to Dr. Silverio Decamp for her review and I will inform pt of Dr. Janann August input. Pt to follow up with her PCP regarding stable adrenal adenoma and gyn regarding uterine fibroid. Pt stated she is having new symptoms since her last office visit, anal irritation without itchiness and more frequent soft stools. She is not taking Miralax. I advised her to stop taking the probiotic she recently started, stop all dairy products and I advised her to schedule an office appt if her new sx persist or worsen.

## 2019-09-25 ENCOUNTER — Telehealth: Payer: Self-pay | Admitting: Gastroenterology

## 2019-09-26 NOTE — Telephone Encounter (Signed)
Left detailed message regarding lab results and comment from Dr. Acie Fredrickson that patient may d/c rosuvastatin due to symptoms of pancreatitis that have occurred. I advised her to call back prior to her May 25 appointment with Dr. Acie Fredrickson with questions, otherwise he can discuss with her at that appointment.

## 2019-09-26 NOTE — Telephone Encounter (Signed)
Do you recommend a certain diet for her?

## 2019-09-26 NOTE — Telephone Encounter (Signed)
Left the patient on message with this information.

## 2019-09-26 NOTE — Telephone Encounter (Signed)
Slowly advance diet as tolerated given no evidence of severe pancreatitis on CT.  Avoid NSAIDs.  Okay to use Tylenol in divided doses up to 2 g/day.  Thank you

## 2019-09-29 ENCOUNTER — Ambulatory Visit: Payer: Managed Care, Other (non HMO) | Admitting: Nurse Practitioner

## 2019-10-05 ENCOUNTER — Other Ambulatory Visit: Payer: Managed Care, Other (non HMO)

## 2019-10-06 ENCOUNTER — Other Ambulatory Visit: Payer: Managed Care, Other (non HMO)

## 2019-10-06 ENCOUNTER — Encounter: Payer: Self-pay | Admitting: Cardiovascular Disease

## 2019-10-06 NOTE — Progress Notes (Signed)
Cardiology Office Note:    Date:  10/10/2019   ID:  Faith [name], DOB 1955-06-01, MRN [id number]  PCP:  Michael Boston, MD  Cardiologist:  Shekela Goodridge  Electrophysiologist:  None   Referring MD: Haywood Pao, MD   Chief Complaint  Patient presents with  . Chest Pain    History of Present Illness:    Faith [name] is a 64 y.o. female with a hx of supraventricular tachycardia.  I have seen her many years ago.  I saw her several months ago as a home visit when she called complaining of chest pain.  She had been having chest pain primarily centered under her left breast.  There was some left shoulder  discomfort as well.   Radiating to back - . Lasted 1-2 hours.  Then gradually resolved.  Kardia tele did nto reveal any ST changed.  The following day she had right-sided chest discomfort.  Pain there is at this pain lasted all day long. Had some dyspnea but no pleuretic cp   Lipids in June, 2020 showed chol of 225, LDL was 138.  Trigs were 30, HDL was 76   Oct 10, 2019 Faith [name] is seen today for follow up of her chest pain  Coronary CT angio shows mild disease in the LAD.  The LCx and RCA are free of disease.  Coronary calcium score of 19. This was 18 percentile for age and sex matched control. She has been started on Rosuvastatin 10 mg a day  Recent  lipid levels reveal:  Chol = 142 HDL is 83 Trigs are 58 LDL is 47  Oct 10, 2019:  Faith [name] is seen today for follow up of her mild CAD, chest pain and coronary calcium score if 19. Still has some palpitations .  Lipids are reviewed and are great Recently had an episode of pancreatitis.  Liver enz are stable .  Has been on a low fat diet since that time  CT of the abd ( several weeks later) looked ok   Her biological father died in his 50s of colon Walks regularly .       Past Medical History:  Diagnosis Date  . Allergy    spring   . Anemia    yrs ago   . Arthritis   . Family history of colon cancer in father   .  Gastric ulcer   . GERD (gastroesophageal reflux disease)   . Hypertension   . MVP (mitral valve prolapse)   . Osteopenia     Past Surgical History:  Procedure Laterality Date  . BREAST EXCISIONAL BIOPSY Right 1986  . BREAST REDUCTION SURGERY    . CHOLECYSTECTOMY    . COLONOSCOPY    . GASTRIC BYPASS    . KNEE ARTHROSCOPY     right  . REDUCTION MAMMAPLASTY Bilateral 2001  . SKIN CANCER EXCISION    . TEMPOROMANDIBULAR JOINT SURGERY    . TONSILLECTOMY AND ADENOIDECTOMY    . TUBAL LIGATION    . UPPER GASTROINTESTINAL ENDOSCOPY      Current Medications: Current Meds  Medication Sig  . AMBULATORY NON FORMULARY MEDICATION Probioslim Extra Strength Probiotic, fat burner and bloat Tae 2 tablet by mouth daily  . AUVI-Q 0.3 MG/0.3ML SOAJ injection Inject 0.3 mg into the muscle as needed.  Marland Kitchen CALCIUM PO Take 1 tablet by mouth daily. Adult chewable calcium supplement   . dicyclomine (BENTYL) 10 MG capsule Take 1 capsule (10 mg total) by mouth every 8 (eight)  hours as needed for spasms.  Marland Kitchen levocetirizine (XYZAL) 5 MG tablet Take 1 tablet by mouth daily.  Marland Kitchen lisinopril (PRINIVIL,ZESTRIL) 20 MG tablet Take 20 mg by mouth.  . Multiple Vitamin (MULITIVITAMIN WITH MINERALS) TABS Take 3 tablets by mouth daily. Adult gummy multi-vitamins   . Probiotic Product (PROBIOTIC DAILY PO) Take 1 capsule daily by mouth.  . rosuvastatin (CRESTOR) 10 MG tablet Take 1 tablet (10 mg total) by mouth daily.     Allergies:   Patient has no known allergies.   Social History   Socioeconomic History  . Marital status: Married    Spouse name: Not on file  . Number of children: 2  . Years of education: Not on file  . Highest education level: Not on file  Occupational History  . Occupation: Mental Health Therapist  Tobacco Use  . Smoking status: Never Smoker  . Smokeless tobacco: Never Used  Substance and Sexual Activity  . Alcohol use: Yes    Alcohol/week: 0.0 standard drinks    Comment: rare  . Drug  use: No  . Sexual activity: Not on file  Other Topics Concern  . Not on file  Social History Narrative  . Not on file   Social Determinants of Health   Financial Resource Strain:   . Difficulty of Paying Living Expenses:   Food Insecurity:   . Worried About Charity fundraiser in the Last Year:   . Arboriculturist in the Last Year:   Transportation Needs:   . Film/video editor (Medical):   Marland Kitchen Lack of Transportation (Non-Medical):   Physical Activity:   . Days of Exercise per Week:   . Minutes of Exercise per Session:   Stress:   . Feeling of Stress :   Social Connections:   . Frequency of Communication with Friends and Family:   . Frequency of Social Gatherings with Friends and Family:   . Attends Religious Services:   . Active Member of Clubs or Organizations:   . Attends Archivist Meetings:   Marland Kitchen Marital Status:      Family History: The patient's family history includes Colon cancer (age of onset: 39) in her father. She was adopted.  ROS:   Please see the history of present illness.     All other systems reviewed and are negative.  EKGs/Labs/Other Studies Reviewed:    The following studies were reviewed today:   EKG:   Jan. 5, 2021.   Sinus brady at 50 . No ST or T wave changes.   Recent Labs: 09/04/2019: Hemoglobin 13.1; Platelets 209.0 09/22/2019: ALT 16; BUN 10; Creatinine, Ser 0.60; Potassium 4.0; Sodium 143  Recent Lipid Panel    Component Value Date/Time   CHOL 142 09/22/2019 0947   TRIG 58 09/22/2019 0947   HDL 83 09/22/2019 0947   CHOLHDL 1.7 09/22/2019 0947   LDLCALC 47 09/22/2019 0947    Physical Exam:    Physical Exam: Blood pressure 128/86, pulse (!) 54, height 5\' 3"  (1.6 m), weight 158 lb (71.7 kg), SpO2 97 %.  GEN:  Well nourished, well developed in no acute distress HEENT: Normal NECK: No JVD; No carotid bruits LYMPHATICS: No lymphadenopathy CARDIAC: RRR , no murmurs, rubs, gallops RESPIRATORY:  Clear to auscultation  without rales, wheezing or rhonchi  ABDOMEN: Soft, non-tender, non-distended MUSCULOSKELETAL:  No edema; No deformity  SKIN: Warm and dry NEUROLOGIC:  Alert and oriented x 3   ASSESSMENT:    1. Coronary artery disease  involving native coronary artery of native heart without angina pectoris   2. Hyperlipidemia LDL goal <70    PLAN:    In order of problems listed above:  1.  Chest pain : She denies having any further episodes of chest pain.  2.  Hyperlipidemia: She has mild hyperlipidemia.  She is on rosuvastatin 10 mg a day.  Coronary calcium score is 19 which places her in the 71st percentile for age/sex matched controls.  She is not having any episodes of angina.  Lipid levels were reviewed and look great.  3.  Recent episode of pancreatitis: She will be seeing the GI doctor.  I do not think was related to her statin therapy.  4.  Hypertension: Blood pressure is well controlled.  Continue current medications.    Medication Adjustments/Labs and Tests Ordered: Current medicines are reviewed at length with the patient today.  Concerns regarding medicines are outlined above.  Orders Placed This Encounter  Procedures  . Lipid Profile  . Basic Metabolic Panel (BMET)  . Hepatic function panel   No orders of the defined types were placed in this encounter.   Patient Instructions  Medication Instructions:  Your physician recommends that you continue on your current medications as directed. Please refer to the Current Medication list given to you today.  *If you need a refill on your cardiac medications before your next appointment, please call your pharmacy*   Lab Work: Your physician recommends that you return for lab work in: 12 months on the day of or a few days before your office visit with Dr. Acie Fredrickson.  You will need to FAST for this appointment - nothing to eat or drink after midnight the night before except water.  If you have labs (blood work) drawn today and your tests  are completely normal, you will receive your results only by: Marland Kitchen MyChart Message (if you have MyChart) OR . A paper copy in the mail If you have any lab test that is abnormal or we need to change your treatment, we will call you to review the results.   Testing/Procedures: None Ordered    Follow-Up: At [facility], you and your health needs are our priority.  As part of our continuing mission to provide you with exceptional heart care, we have created designated Provider Care Teams.  These Care Teams include your primary Cardiologist (physician) and Advanced Practice Providers (APPs -  Physician Assistants and Nurse Practitioners) who all work together to provide you with the care you need, when you need it.   Your next appointment:   1 year(s)  The format for your next appointment:   In Person  Provider:   You may see Mertie Moores, MD or one of the following Advanced Practice Providers on your designated Care Team:    Richardson Dopp, PA-C  Vin [place], [place]    Other Instructions  Mediterranean Diet A Mediterranean diet refers to food and lifestyle choices that are based on the traditions of countries located on the [employer]. This way of eating has been shown to help prevent certain conditions and improve outcomes for people who have chronic diseases, like kidney disease and heart disease. What are tips for following this plan? Lifestyle  Cook and eat meals together with your family, when possible.  Drink enough fluid to keep your urine clear or pale yellow.  Be physically active every day. This includes: ? Aerobic exercise like running or swimming. ? Leisure activities like gardening, walking, or housework.  Get 7-8 hours of sleep each night.  If recommended by your health care provider, drink red wine in moderation. This means 1 glass a day for nonpregnant women and 2 glasses a day for men. A glass of wine equals 5 oz (150 mL). Reading food labels   Check  the serving size of packaged foods. For foods such as rice and pasta, the serving size refers to the amount of cooked product, not dry.  Check the total fat in packaged foods. Avoid foods that have saturated fat or trans fats.  Check the ingredients list for added sugars, such as corn syrup. Shopping  At the grocery store, buy most of your food from the areas near the walls of the store. This includes: ? Fresh fruits and vegetables (produce). ? Grains, beans, nuts, and seeds. Some of these may be available in unpackaged forms or large amounts (in bulk). ? Fresh seafood. ? Poultry and eggs. ? Low-fat dairy products.  Buy whole ingredients instead of prepackaged foods.  Buy fresh fruits and vegetables in-season from local farmers markets.  Buy frozen fruits and vegetables in resealable bags.  If you do not have access to quality fresh seafood, buy precooked frozen shrimp or canned fish, such as tuna, salmon, or sardines.  Buy small amounts of raw or cooked vegetables, salads, or olives from the deli or salad bar at your store.  Stock your pantry so you always have certain foods on hand, such as olive oil, canned tuna, canned tomatoes, rice, pasta, and beans. Cooking  Cook foods with extra-virgin olive oil instead of using butter or other vegetable oils.  Have meat as a side dish, and have vegetables or grains as your main dish. This means having meat in small portions or adding small amounts of meat to foods like pasta or stew.  Use beans or vegetables instead of meat in common dishes like chili or lasagna.  Experiment with different cooking methods. Try roasting or broiling vegetables instead of steaming or sauteing them.  Add frozen vegetables to soups, stews, pasta, or rice.  Add nuts or seeds for added healthy fat at each meal. You can add these to yogurt, salads, or vegetable dishes.  Marinate fish or vegetables using olive oil, lemon juice, garlic, and fresh herbs. Meal  planning   Plan to eat 1 vegetarian meal one day each week. Try to work up to 2 vegetarian meals, if possible.  Eat seafood 2 or more times a week.  Have healthy snacks readily available, such as: ? Vegetable sticks with hummus. ? [place] yogurt. ? Fruit and nut trail mix.  Eat balanced meals throughout the week. This includes: ? Fruit: 2-3 servings a day ? Vegetables: 4-5 servings a day ? Low-fat dairy: 2 servings a day ? Fish, poultry, or lean meat: 1 serving a day ? Beans and legumes: 2 or more servings a week ? Nuts and seeds: 1-2 servings a day ? Whole grains: 6-8 servings a day ? Extra-virgin olive oil: 3-4 servings a day  Limit red meat and sweets to only a few servings a month What are my food choices?  Mediterranean diet ? Recommended  Grains: Whole-grain pasta. Brown rice. Bulgar wheat. Polenta. Couscous. Whole-wheat bread. [place].  Vegetables: Artichokes. Beets. Broccoli. Cabbage. Carrots. Eggplant. Green beans. Chard. Kale. Spinach. Onions. Leeks. Peas. Squash. Tomatoes. Peppers. Radishes.  Fruits: Apples. Apricots. Avocado. Berries. Bananas. Cherries. Dates. Figs. Grapes. Lemons. Melon. Oranges. Peaches. Plums. Pomegranate.  Meats and other protein foods: Beans. Almonds.  Sunflower seeds. Pine nuts. Peanuts. [place]. Salmon. Scallops. Shrimp. [place]. Tilapia. Clams. Oysters. Eggs.  Dairy: Low-fat milk. Cheese. Greek yogurt.  Beverages: Water. Red wine. Herbal tea.  Fats and oils: Extra virgin olive oil. Avocado oil. Grape seed oil.  Sweets and desserts: [place] yogurt with honey. Baked apples. Poached pears. Trail mix.  Seasoning and other foods: Basil. Cilantro. Coriander. Cumin. Mint. Parsley. Sage. Rosemary. Tarragon. Garlic. Oregano. Thyme. Pepper. Balsalmic vinegar. Tahini. Hummus. Tomato sauce. Olives. Mushrooms. ? Limit these  Grains: Prepackaged pasta or rice dishes. Prepackaged cereal with added sugar.  Vegetables: Deep fried potatoes (french  fries).  Fruits: Fruit canned in syrup.  Meats and other protein foods: Beef. Pork. Lamb. Poultry with skin. Hot dogs. Berniece Salines.  Dairy: Ice cream. Sour cream. Whole milk.  Beverages: Juice. Sugar-sweetened soft drinks. Beer. Liquor and spirits.  Fats and oils: Butter. Canola oil. Vegetable oil. Beef fat (tallow). Lard.  Sweets and desserts: Cookies. Cakes. Pies. Candy.  Seasoning and other foods: Mayonnaise. Premade sauces and marinades. The items listed may not be a complete list. Talk with your dietitian about what dietary choices are right for you. Summary  The Mediterranean diet includes both food and lifestyle choices.  Eat a variety of fresh fruits and vegetables, beans, nuts, seeds, and whole grains.  Limit the amount of red meat and sweets that you eat.  Talk with your health care provider about whether it is safe for you to drink red wine in moderation. This means 1 glass a day for nonpregnant women and 2 glasses a day for men. A glass of wine equals 5 oz (150 mL). This information is not intended to replace advice given to you by your health care provider. Make sure you discuss any questions you have with your health care provider. Document Revised: 01/02/2016 Document Reviewed: 12/26/2015 Elsevier Patient Education  2020 [place], Mertie Moores, MD  10/10/2019 8:46 AM    [place]

## 2019-10-10 ENCOUNTER — Ambulatory Visit (INDEPENDENT_AMBULATORY_CARE_PROVIDER_SITE_OTHER): Payer: Managed Care, Other (non HMO) | Admitting: Cardiovascular Disease

## 2019-10-10 ENCOUNTER — Other Ambulatory Visit: Payer: Self-pay

## 2019-10-10 ENCOUNTER — Encounter: Payer: Self-pay | Admitting: Cardiovascular Disease

## 2019-10-10 VITALS — BP 128/86 | HR 54 | Ht 63.0 in | Wt 158.0 lb

## 2019-10-10 DIAGNOSIS — I2510 Atherosclerotic heart disease of native coronary artery without angina pectoris: Secondary | ICD-10-CM | POA: Diagnosis not present

## 2019-10-10 DIAGNOSIS — E782 Mixed hyperlipidemia: Secondary | ICD-10-CM

## 2019-10-10 DIAGNOSIS — I2584 Coronary atherosclerosis due to calcified coronary lesion: Secondary | ICD-10-CM | POA: Diagnosis not present

## 2019-10-10 DIAGNOSIS — E785 Hyperlipidemia, unspecified: Secondary | ICD-10-CM | POA: Diagnosis not present

## 2019-10-10 NOTE — Patient Instructions (Signed)
Medication Instructions:  Your physician recommends that you continue on your current medications as directed. Please refer to the Current Medication list given to you today.  *If you need a refill on your cardiac medications before your next appointment, please call your pharmacy*   Lab Work: Your physician recommends that you return for lab work in: 12 months on the day of or a few days before your office visit with Dr. Acie Fredrickson.  You will need to FAST for this appointment - nothing to eat or drink after midnight the night before except water.  If you have labs (blood work) drawn today and your tests are completely normal, you will receive your results only by: Marland Kitchen MyChart Message (if you have MyChart) OR . A paper copy in the mail If you have any lab test that is abnormal or we need to change your treatment, we will call you to review the results.   Testing/Procedures: None Ordered    Follow-Up: At [facility], you and your health needs are our priority.  As part of our continuing mission to provide you with exceptional heart care, we have created designated Provider Care Teams.  These Care Teams include your primary Cardiologist (physician) and Advanced Practice Providers (APPs -  Physician Assistants and Nurse Practitioners) who all work together to provide you with the care you need, when you need it.   Your next appointment:   1 year(s)  The format for your next appointment:   In Person  Provider:   You may see Mertie Moores, MD or one of the following Advanced Practice Providers on your designated Care Team:    Richardson Dopp, PA-C  [place]in [place], [place]    Other Instructions  Mediterranean Diet A Mediterranean diet refers to food and lifestyle choices that are based on the traditions of countries located on the [employer]. This way of eating has been shown to help prevent certain conditions and improve outcomes for people who have chronic diseases, like kidney  disease and heart disease. What are tips for following this plan? Lifestyle  Cook and eat meals together with your family, when possible.  Drink enough fluid to keep your urine clear or pale yellow.  Be physically active every day. This includes: ? Aerobic exercise like running or swimming. ? Leisure activities like gardening, walking, or housework.  Get 7-8 hours of sleep each night.  If recommended by your health care provider, drink red wine in moderation. This means 1 glass a day for nonpregnant women and 2 glasses a day for men. A glass of wine equals 5 oz (150 mL). Reading food labels   Check the serving size of packaged foods. For foods such as rice and pasta, the serving size refers to the amount of cooked product, not dry.  Check the total fat in packaged foods. Avoid foods that have saturated fat or trans fats.  Check the ingredients list for added sugars, such as corn syrup. Shopping  At the grocery store, buy most of your food from the areas near the walls of the store. This includes: ? Fresh fruits and vegetables (produce). ? Grains, beans, nuts, and seeds. Some of these may be available in unpackaged forms or large amounts (in bulk). ? Fresh seafood. ? Poultry and eggs. ? Low-fat dairy products.  Buy whole ingredients instead of prepackaged foods.  Buy fresh fruits and vegetables in-season from local farmers markets.  Buy frozen fruits and vegetables in resealable bags.  If you do not have  access to quality fresh seafood, buy precooked frozen shrimp or canned fish, such as tuna, salmon, or sardines.  Buy small amounts of raw or cooked vegetables, salads, or olives from the deli or salad bar at your store.  Stock your pantry so you always have certain foods on hand, such as olive oil, canned tuna, canned tomatoes, rice, pasta, and beans. Cooking  Cook foods with extra-virgin olive oil instead of using butter or other vegetable oils.  Have meat as a side  dish, and have vegetables or grains as your main dish. This means having meat in small portions or adding small amounts of meat to foods like pasta or stew.  Use beans or vegetables instead of meat in common dishes like chili or lasagna.  Experiment with different cooking methods. Try roasting or broiling vegetables instead of steaming or sauteing them.  Add frozen vegetables to soups, stews, pasta, or rice.  Add nuts or seeds for added healthy fat at each meal. You can add these to yogurt, salads, or vegetable dishes.  Marinate fish or vegetables using olive oil, lemon juice, garlic, and fresh herbs. Meal planning   Plan to eat 1 vegetarian meal one day each week. Try to work up to 2 vegetarian meals, if possible.  Eat seafood 2 or more times a week.  Have healthy snacks readily available, such as: ? Vegetable sticks with hummus. ? [place] yogurt. ? Fruit and nut trail mix.  Eat balanced meals throughout the week. This includes: ? Fruit: 2-3 servings a day ? Vegetables: 4-5 servings a day ? Low-fat dairy: 2 servings a day ? Fish, poultry, or lean meat: 1 serving a day ? Beans and legumes: 2 or more servings a week ? Nuts and seeds: 1-2 servings a day ? Whole grains: 6-8 servings a day ? Extra-virgin olive oil: 3-4 servings a day  Limit red meat and sweets to only a few servings a month What are my food choices?  Mediterranean diet ? Recommended  Grains: Whole-grain pasta. Brown rice. Bulgar wheat. Polenta. Couscous. Whole-wheat bread. [place].  Vegetables: Artichokes. Beets. Broccoli. Cabbage. Carrots. Eggplant. Green beans. Chard. Kale. Spinach. Onions. Leeks. Peas. Squash. Tomatoes. Peppers. Radishes.  Fruits: Apples. Apricots. Avocado. Berries. Bananas. Cherries. Dates. Figs. Grapes. Lemons. Melon. Oranges. Peaches. Plums. Pomegranate.  Meats and other protein foods: Beans. Almonds. Sunflower seeds. Pine nuts. Peanuts. [place]. Salmon. Scallops. Shrimp. [place].  Tilapia. Clams. Oysters. Eggs.  Dairy: Low-fat milk. Cheese. Greek yogurt.  Beverages: Water. Red wine. Herbal tea.  Fats and oils: Extra virgin olive oil. Avocado oil. Grape seed oil.  Sweets and desserts: [place] yogurt with honey. Baked apples. Poached pears. Trail mix.  Seasoning and other foods: Basil. Cilantro. Coriander. Cumin. Mint. Parsley. Sage. Rosemary. Tarragon. Garlic. Oregano. Thyme. Pepper. Balsalmic vinegar. Tahini. Hummus. Tomato sauce. Olives. Mushrooms. ? Limit these  Grains: Prepackaged pasta or rice dishes. Prepackaged cereal with added sugar.  Vegetables: Deep fried potatoes (french fries).  Fruits: Fruit canned in syrup.  Meats and other protein foods: Beef. Pork. Lamb. Poultry with skin. Hot dogs. Bernie[place] Salines.  Dairy: Ice cream. Sour cream. Whole milk.  Beverages: Juice. Sugar-sweetened soft drinks. Beer. Liquor and spirits.  Fats and oils: Butter. Canola oil. Vegetable oil. Beef fat (tallow). Lard.  Sweets and desserts: Cookies. Cakes. Pies. Candy.  Seasoning and other foods: Mayonnaise. Premade sauces and marinades. The items listed may not be a complete list. Talk with your dietitian about what dietary choices are right for you. Summary  The [place]  diet includes both food and lifestyle choices.  Eat a variety of fresh fruits and vegetables, beans, nuts, seeds, and whole grains.  Limit the amount of red meat and sweets that you eat.  Talk with your health care provider about whether it is safe for you to drink red wine in moderation. This means 1 glass a day for nonpregnant women and 2 glasses a day for men. A glass of wine equals 5 oz (150 mL). This information is not intended to replace advice given to you by your health care provider. Make sure you discuss any questions you have with your health care provider. Document Revised: 01/02/2016 Document Reviewed: 12/26/2015 Elsevier Patient Education  [place].
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
## 2019-11-06 ENCOUNTER — Telehealth: Payer: Self-pay | Admitting: Nurse Practitioner

## 2019-11-06 NOTE — Telephone Encounter (Signed)
Patient called would like to get some labs done prior to her appt 11/08/19 states she is feeling weak lately. Please advise

## 2019-11-07 ENCOUNTER — Other Ambulatory Visit (INDEPENDENT_AMBULATORY_CARE_PROVIDER_SITE_OTHER): Payer: Managed Care, Other (non HMO)

## 2019-11-07 ENCOUNTER — Other Ambulatory Visit: Payer: Self-pay

## 2019-11-07 DIAGNOSIS — R1084 Generalized abdominal pain: Secondary | ICD-10-CM | POA: Diagnosis not present

## 2019-11-07 DIAGNOSIS — R748 Abnormal levels of other serum enzymes: Secondary | ICD-10-CM | POA: Diagnosis not present

## 2019-11-07 LAB — CBC WITH DIFFERENTIAL/PLATELET
Basophils Absolute: 0 10*3/uL (ref 0.0–0.1)
Basophils Relative: 0.3 % (ref 0.0–3.0)
Eosinophils Absolute: 0.1 10*3/uL (ref 0.0–0.7)
Eosinophils Relative: 1.3 % (ref 0.0–5.0)
HCT: 39.8 % (ref 36.0–46.0)
Hemoglobin: 13.2 g/dL (ref 12.0–15.0)
Lymphocytes Relative: 41.9 % (ref 12.0–46.0)
Lymphs Abs: 2.4 10*3/uL (ref 0.7–4.0)
MCHC: 33.2 g/dL (ref 30.0–36.0)
MCV: 96.6 fl (ref 78.0–100.0)
Monocytes Absolute: 0.4 10*3/uL (ref 0.1–1.0)
Monocytes Relative: 7.1 % (ref 3.0–12.0)
Neutro Abs: 2.8 10*3/uL (ref 1.4–7.7)
Neutrophils Relative %: 49.4 % (ref 43.0–77.0)
Platelets: 183 10*3/uL (ref 150.0–400.0)
RBC: 4.12 Mil/uL (ref 3.87–5.11)
RDW: 14.5 % (ref 11.5–15.5)
WBC: 5.7 10*3/uL (ref 4.0–10.5)

## 2019-11-07 LAB — COMPREHENSIVE METABOLIC PANEL
ALT: 15 U/L (ref 0–35)
AST: 17 U/L (ref 0–37)
Albumin: 4.4 g/dL (ref 3.5–5.2)
Alkaline Phosphatase: 43 U/L (ref 39–117)
BUN: 13 mg/dL (ref 6–23)
CO2: 30 mEq/L (ref 19–32)
Calcium: 9.3 mg/dL (ref 8.4–10.5)
Chloride: 106 mEq/L (ref 96–112)
Creatinine, Ser: 0.63 mg/dL (ref 0.40–1.20)
GFR: 95.6 mL/min (ref >60.00)
Glucose, Bld: 95 mg/dL (ref 70–99)
Potassium: 4 mEq/L (ref 3.5–5.1)
Sodium: 142 mEq/L (ref 135–145)
Total Bilirubin: 0.5 mg/dL (ref 0.2–1.2)
Total Protein: 6.4 g/dL (ref 6.0–8.3)

## 2019-11-07 LAB — LIPASE: Lipase: 56 U/L (ref 11.0–59.0)

## 2019-11-07 NOTE — Telephone Encounter (Signed)
Please check CBC, CMP, and lipase.  Thank you

## 2019-11-07 NOTE — Telephone Encounter (Signed)
Spoke with the patient. She is unable to come now. She will wait for her appointment tomorrow.

## 2019-11-07 NOTE — Telephone Encounter (Signed)
Can we get labs today? She will see you tomorrow.

## 2019-11-08 ENCOUNTER — Ambulatory Visit (INDEPENDENT_AMBULATORY_CARE_PROVIDER_SITE_OTHER): Payer: Managed Care, Other (non HMO) | Admitting: Gastroenterology

## 2019-11-08 ENCOUNTER — Other Ambulatory Visit (INDEPENDENT_AMBULATORY_CARE_PROVIDER_SITE_OTHER): Payer: Managed Care, Other (non HMO)

## 2019-11-08 ENCOUNTER — Encounter: Payer: Self-pay | Admitting: Gastroenterology

## 2019-11-08 VITALS — BP 126/78 | HR 72 | Ht 63.0 in | Wt 154.0 lb

## 2019-11-08 DIAGNOSIS — Z9884 Bariatric surgery status: Secondary | ICD-10-CM

## 2019-11-08 DIAGNOSIS — R5383 Other fatigue: Secondary | ICD-10-CM | POA: Diagnosis not present

## 2019-11-08 DIAGNOSIS — Z8719 Personal history of other diseases of the digestive system: Secondary | ICD-10-CM

## 2019-11-08 LAB — IBC + FERRITIN
Ferritin: 6.6 ng/mL — ABNORMAL LOW (ref 10.0–291.0)
Iron: 103 ug/dL (ref 42–145)
Saturation Ratios: 22.3 % (ref 20.0–50.0)
Transferrin: 330 mg/dL (ref 212.0–360.0)

## 2019-11-08 LAB — FOLATE: Folate: 22.9 ng/mL (ref >5.9)

## 2019-11-08 LAB — VITAMIN B12: Vitamin B-12: 396 pg/mL (ref 211–911)

## 2019-11-08 LAB — VITAMIN D 25 HYDROXY (VIT D DEFICIENCY, FRACTURES): VITD: 57.43 ng/mL (ref 30.00–100.00)

## 2019-11-08 NOTE — Patient Instructions (Signed)
Go to the basement for labs today  Take a bariatric multivitamin daily   Follow up in 6 months  I appreciate the  opportunity to care for you  Thank You   Harl Bowie , MD

## 2019-11-08 NOTE — Progress Notes (Signed)
Faith [name]    [id number]    [birth year]  Primary Care Physician:Wile, Jesse Sans, MD  Referring Physician: Haywood Pao, MD [address] [place],  [place] [address]   Chief complaint: Fatigue, history of pancreatitis HPI: 64 year old very pleasant female with history of gastric bypass surgery and cholecystectomy in 2009 is here for follow-up visit after recent episode of mild pancreatitis.  Complains of generalized fatigue.  Reviewed labs from yesterday, CBC, CMP and lipase within normal range  Denies abdominal pain, vomiting, change in bowel habits, blood per rectum  No medication changes other than Crestor, per cardiology the benefit of staying on Crestor outweighs the risk for possible mild pancreatitis and recommended her to continue it.  Taking multivitamins but not bariatric vitamins.   CT abdomen pelvis with contrast Sep 22, 2019 Mass lesion in the right hepatic lobe consistent with benign focal nodular hyperplasia, no significant change in size as compared to prior MRI in 2018 S/p cholecystectomy.  Normal pancreas. Stable postop changes s/p gastric bypass surgery Stable benign appearing left adrenal adenoma Uterine fibroid  EGD 04/02/2017: - Normal esophagus. - Roux-en-Y gastrojejunostomy with gastrojejunal anastomosis characterized by healthy appearing mucosa.  - Normal examined jejunum.  Colonoscopy 04/02/2017:  - One 4 mm polyp in the ascending colon, removed with a cold snare. Resected and retrieved. - Two 1 to 2 mm polyps in the sigmoid colon and in the cecum, removed with a cold biopsy forceps. - Non-bleeding internal hemorrhoids.  -5 year recall.   Biologic father had colon cancer at age 45  Biopsies:  31. Surgical [P], ascending and cecum, polyp (2) - TUBULAR ADENOMA(S). - HIGH GRADE DYSPLASIA IS NOT IDENTIFIED. 2. Surgical [P], sigmoid, polyp - HYPERPLASTIC POLYP(S). - THERE IS NO EVIDENCE OF MALIGNANCY  Abdominal MRI  02/16/2017: 1. Hypervascular 2.4 cm inferior right liver lobe mass is stable since 04/01/2016 CT abdomen study and demonstrates MRI features compatible with benign focal nodular hyperplasia ([place]). 2. Left adrenal adenoma. 3. No acute abnormality  Outpatient Encounter Medications as of 11/08/2019  Medication Sig  . AMBULATORY NON FORMULARY MEDICATION Probioslim Extra Strength Probiotic, fat burner and bloat Tae 2 tablet by mouth daily  . AUVI-Q 0.3 MG/0.3ML SOAJ injection Inject 0.3 mg into the muscle as needed.  Marland Kitchen CALCIUM PO Take 1 tablet by mouth daily. Adult chewable calcium supplement   . dicyclomine (BENTYL) 10 MG capsule Take 1 capsule (10 mg total) by mouth every 8 (eight) hours as needed for spasms.  Marland Kitchen levocetirizine (XYZAL) 5 MG tablet Take 1 tablet by mouth daily.  Marland Kitchen lisinopril (PRINIVIL,ZESTRIL) 20 MG tablet Take 20 mg by mouth.  . Multiple Vitamin (MULITIVITAMIN WITH MINERALS) TABS Take 3 tablets by mouth daily. Adult gummy multi-vitamins   . Probiotic Product (PROBIOTIC DAILY PO) Take 1 capsule daily by mouth.  . rosuvastatin (CRESTOR) 10 MG tablet Take 1 tablet (10 mg total) by mouth daily.   No facility-administered encounter medications on file as of 11/08/2019.    Allergies as of 11/08/2019  . (No Known Allergies)    Past Medical History:  Diagnosis Date  . Allergy    spring   . Anemia    yrs ago   . Arthritis   . Family history of colon cancer in father   . Gastric ulcer   . GERD (gastroesophageal reflux disease)   . Hypertension   . MVP (mitral valve prolapse)   . Osteopenia  Past Surgical History:  Procedure Laterality Date  . BREAST EXCISIONAL BIOPSY Right 1986  . BREAST REDUCTION SURGERY    . CHOLECYSTECTOMY    . COLONOSCOPY    . GASTRIC BYPASS    . KNEE ARTHROSCOPY     right  . REDUCTION MAMMAPLASTY Bilateral 2001  . SKIN CANCER EXCISION    . TEMPOROMANDIBULAR JOINT SURGERY    . TONSILLECTOMY AND ADENOIDECTOMY    . TUBAL LIGATION    .  UPPER GASTROINTESTINAL ENDOSCOPY      Family History  Adopted: Yes  Problem Relation Age of Onset  . Colon cancer Father 104    Social History   Socioeconomic History  . Marital status: Married    Spouse name: Not on file  . Number of children: 2  . Years of education: Not on file  . Highest education level: Not on file  Occupational History  . Occupation: Mental Health Therapist  Tobacco Use  . Smoking status: Never Smoker  . Smokeless tobacco: Never Used  Vaping Use  . Vaping Use: Never used  Substance and Sexual Activity  . Alcohol use: Yes    Alcohol/week: 0.0 standard drinks    Comment: rare  . Drug use: No  . Sexual activity: Not on file  Other Topics Concern  . Not on file  Social History Narrative  . Not on file   Social Determinants of Health   Financial Resource Strain:   . Difficulty of Paying Living Expenses:   Food Insecurity:   . Worried About Charity fundraiser in the Last Year:   . Arboriculturist in the Last Year:   Transportation Needs:   . Film/video editor (Medical):   Marland Kitchen Lack of Transportation (Non-Medical):   Physical Activity:   . Days of Exercise per Week:   . Minutes of Exercise per Session:   Stress:   . Feeling of Stress :   Social Connections:   . Frequency of Communication with Friends and Family:   . Frequency of Social Gatherings with Friends and Family:   . Attends Religious Services:   . Active Member of Clubs or Organizations:   . Attends Archivist Meetings:   Marland Kitchen Marital Status:   Intimate Partner Violence:   . Fear of Current or Ex-Partner:   . Emotionally Abused:   Marland Kitchen Physically Abused:   . Sexually Abused:       Review of systems: All other review of systems negative except as mentioned in the HPI.   Physical Exam: Vitals:   11/08/19 0955  BP: 126/78  Pulse: 72   Body mass index is 27.28 kg/m. Gen:      No acute distress HEENT:   sclera anicteric Abd:       soft, non-tender; no palpable  masses, no distension Ext:    No edema Neuro: alert and oriented x 3 Psych: normal mood and affect  Data Reviewed:  Reviewed labs, radiology imaging, old records and pertinent past GI work up   Assessment and Plan/Recommendations:  64 year old very pleasant female with history of gastric bypass surgery, s/p cholecystectomy in 2009 here for follow-up visit after recent episode of mild pancreatitis  Unclear etiology for pancreatitis CT abdomen pelvis negative for any pancreatic cyst lesions or acute inflammation Advised patient to avoid alcohol Continue current medications, if continues to have recurrent episodes may have to consider switching lisinopril and Crestor to alternate medications  Maintain adequate hydration   Right liver lesion consistent  with focal nodular hyperplasia, no significant change in size compared to 2018  History of adenomatous colon polyps and family history of colon cancer: Due for surveillance colonoscopy November 2023  Status post gastric bypass surgery with complaints of fatigue, will need to exclude iron, B12 and folate deficiency Check vitamin B12, folate, iron panel and vitamin D level Advised patient to start taking bariatric multivitamins daily  Return in 6 months or sooner if needed  This visit required >40 minutes of patient care (this includes precharting, chart review, review of results, face-to-face time used for counseling as well as treatment plan and follow-up. The patient was provided an opportunity to ask questions and all were answered. The patient agreed with the plan and demonstrated an understanding of the instructions.  Damaris Hippo , MD    CC: Tisovec, Fransico Him, MD

## 2019-11-09 ENCOUNTER — Telehealth: Payer: Self-pay | Admitting: Gastroenterology

## 2019-11-09 NOTE — Telephone Encounter (Signed)
Pt called inquiring about her results from yesterday. Pls call her when they are ready.

## 2019-11-09 NOTE — Telephone Encounter (Signed)
If she has an active "my Chart" account she should be able to see them. Dr Silverio Decamp has not sent the results to me.

## 2019-11-10 NOTE — Telephone Encounter (Signed)
Please see result note.  Thanks.

## 2019-11-10 NOTE — Telephone Encounter (Signed)
Patient is inquiring on her labs

## 2020-03-25 ENCOUNTER — Telehealth: Payer: Self-pay | Admitting: Cardiovascular Disease

## 2020-03-25 ENCOUNTER — Encounter: Payer: Self-pay | Admitting: Cardiovascular Disease

## 2020-03-25 MED ORDER — LOSARTAN POTASSIUM 50 MG PO TABS
50.0000 mg | ORAL_TABLET | Freq: Every day | ORAL | 3 refills | Status: DC
Start: 2020-03-25 — End: 2021-03-14

## 2020-03-25 NOTE — Telephone Encounter (Signed)
Pt c/o BP issue: STAT if pt c/o blurred vision, one-sided weakness or slurred speech  1. What are your last 5 BP readings? 03/20/20: 148/90 03/18/20: 160/100  2. Are you having any other symptoms (ex. Dizziness, headache, blurred vision, passed out)? Previously had some dizziness upon standing or bending over but that has subsided  3. What is your BP issue? Pt states her BP is not normally this high  Pt is scheduled for an appt tomorrow (03/26/20) at 3:20

## 2020-03-25 NOTE — Telephone Encounter (Signed)
Called patient about her message. Patient stated her BP has been running high. HR in the 60's. Asked patient about the salt in her diet. Patient stated she is usually good about watching the salt in her diet. Asked patient if she has been taking lisinopril 20 mg. Patient stated, no that she has not taken it in a long time. Consulted Dr. Acie Fredrickson, he advised to start patient on Losartan 50 mg by mouth daily, take lisinopril off patient's list,  and to follow up with him soon. Sent Losartan to patient's pharmacy. Will take lisinopril off patient's list. Patient has appointment tomorrow with Dr. Acie Fredrickson. Patient verbalized understanding.

## 2020-03-25 NOTE — Progress Notes (Signed)
Cardiology Office Note:    Date:  03/26/2020   ID:  ADREANA COULL, DOB 1955/09/26, MRN [id number]  PCP:  Michael Boston, MD  Cardiologist:  Kailynn Satterly  Electrophysiologist:  None   Referring MD: Michael Boston, MD   Chief Complaint  Patient presents with  . Hypertension     previous notes:   Faith [name] is a 64 y.o. female with a hx of supraventricular tachycardia.  I have seen her many years ago.  I saw her several months ago as a home visit when she called complaining of chest pain.  She had been having chest pain primarily centered under her left breast.  There was some left shoulder  discomfort as well.   Radiating to back - . Lasted 1-2 hours.  Then gradually resolved.  Kardia tele did nto reveal any ST changed.  The following day she had right-sided chest discomfort.  Pain there is at this pain lasted all day long. Had some dyspnea but no pleuretic cp   Lipids in June, 2020 showed chol of 225, LDL was 138.  Trigs were 67, HDL was 76   Oct 10, 2019 Faith [name] is seen today for follow up of her chest pain  Coronary CT angio shows mild disease in the LAD.  The LCx and RCA are free of disease.  Coronary calcium score of 19. This was 45 percentile for age and sex matched control. She has been started on Rosuvastatin 10 mg a day  Recent  lipid levels reveal:  Chol = 142 HDL is 83 Trigs are 58 LDL is 47  Oct 10, 2019:  Faith [name] is seen today for follow up of her mild CAD, chest pain and coronary calcium score if 19. Still has some palpitations .  Lipids are reviewed and are great Recently had an episode of pancreatitis.  Liver enz are stable .  Has been on a low fat diet since that time  CT of the abd ( several weeks later) looked ok   Her biological father died in his 66s of colon Walks regularly .      March 26, 2020:  Faith [name] is seen today for follow-up of her mild coronary artery disease, history of chest pain with a coronary calcium score of 19.  She has had some  palpitations.  Her blood pressure has been elevated recently.  Pt is having some surgery in March,   Will need an ECG several months prior .  She is at low risk for her surgery .  Has no significant CAD.  Has mild coronary plaque.    Coronary calcium score of 19.     Past Medical History:  Diagnosis Date  . Allergy    spring   . Anemia    yrs ago   . Arthritis   . Family history of colon cancer in father   . Gastric ulcer   . GERD (gastroesophageal reflux disease)   . Hypertension   . MVP (mitral valve prolapse)   . Osteopenia     Past Surgical History:  Procedure Laterality Date  . BREAST EXCISIONAL BIOPSY Right 1986  . BREAST REDUCTION SURGERY    . CHOLECYSTECTOMY    . COLONOSCOPY    . GASTRIC BYPASS    . KNEE ARTHROSCOPY     right  . REDUCTION MAMMAPLASTY Bilateral 2001  . SKIN CANCER EXCISION    . TEMPOROMANDIBULAR JOINT SURGERY    . TONSILLECTOMY AND ADENOIDECTOMY    . TUBAL LIGATION    .  UPPER GASTROINTESTINAL ENDOSCOPY      Current Medications: Current Meds  Medication Sig  . AMBULATORY NON FORMULARY MEDICATION Probioslim Extra Strength Probiotic, fat burner and bloat Tae 2 tablet by mouth daily  . AUVI-Q 0.3 MG/0.3ML SOAJ injection Inject 0.3 mg into the muscle as needed.  . B Complex Vitamins (VITAMIN B COMPLEX PO) Take 1 tablet by mouth daily.  Marland Kitchen CALCIUM PO Take 1 tablet by mouth daily. Adult chewable calcium supplement   . Ferrous Sulfate (IRON PO) Take 1 tablet by mouth daily.  Marland Kitchen levocetirizine (XYZAL) 5 MG tablet Take 1 tablet by mouth daily.  Marland Kitchen losartan (COZAAR) 50 MG tablet Take 1 tablet (50 mg total) by mouth daily.  . Multiple Vitamin (MULITIVITAMIN WITH MINERALS) TABS Take 3 tablets by mouth daily. Adult gummy multi-vitamins   . Probiotic Product (PROBIOTIC DAILY PO) Take 1 capsule daily by mouth.  . rosuvastatin (CRESTOR) 10 MG tablet Take 1 tablet (10 mg total) by mouth daily.  Marland Kitchen VITAMIN D PO Take 1 tablet by mouth daily.  . [DISCONTINUED]  rosuvastatin (CRESTOR) 10 MG tablet Take 1 tablet (10 mg total) by mouth daily.     Allergies:   Patient has no known allergies.   Social History   Socioeconomic History  . Marital status: Married    Spouse name: Not on file  . Number of children: 2  . Years of education: Not on file  . Highest education level: Not on file  Occupational History  . Occupation: Mental Health Therapist  Tobacco Use  . Smoking status: Never Smoker  . Smokeless tobacco: Never Used  Vaping Use  . Vaping Use: Never used  Substance and Sexual Activity  . Alcohol use: Yes    Alcohol/week: 0.0 standard drinks    Comment: rare  . Drug use: No  . Sexual activity: Not on file  Other Topics Concern  . Not on file  Social History Narrative  . Not on file   Social Determinants of Health   Financial Resource Strain:   . Difficulty of Paying Living Expenses: Not on file  Food Insecurity:   . Worried About Charity fundraiser in the Last Year: Not on file  . Ran Out of Food in the Last Year: Not on file  Transportation Needs:   . Lack of Transportation (Medical): Not on file  . Lack of Transportation (Non-Medical): Not on file  Physical Activity:   . Days of Exercise per Week: Not on file  . Minutes of Exercise per Session: Not on file  Stress:   . Feeling of Stress : Not on file  Social Connections:   . Frequency of Communication with Friends and Family: Not on file  . Frequency of Social Gatherings with Friends and Family: Not on file  . Attends Religious Services: Not on file  . Active Member of Clubs or Organizations: Not on file  . Attends Archivist Meetings: Not on file  . Marital Status: Not on file     Family History: The patient's family history includes Colon cancer (age of onset: 33) in her father. She was adopted.  ROS:   Please see the history of present illness.     All other systems reviewed and are negative.  EKGs/Labs/Other Studies Reviewed:    The following  studies were reviewed today:   EKG:     Recent Labs: 11/07/2019: ALT 15; BUN 13; Creatinine, Ser 0.63; Hemoglobin 13.2; Platelets 183.0; Potassium 4.0; Sodium 142  Recent  Lipid Panel    Component Value Date/Time   CHOL 142 09/22/2019 0947   TRIG 58 09/22/2019 0947   HDL 83 09/22/2019 0947   CHOLHDL 1.7 09/22/2019 0947   LDLCALC 47 09/22/2019 0947    Physical Exam:    Physical Exam: Blood pressure 114/60, pulse 60, height 5\' 3"  (1.6 m), weight 153 lb 3.2 oz (69.5 kg), SpO2 93 %.  GEN:  Well nourished, well developed in no acute distress HEENT: Normal NECK: No JVD; No carotid bruits LYMPHATICS: No lymphadenopathy CARDIAC: RRR , no murmurs, rubs, gallops RESPIRATORY:  Clear to auscultation without rales, wheezing or rhonchi  ABDOMEN: Soft, non-tender, non-distended MUSCULOSKELETAL:  No edema; No deformity  SKIN: Warm and dry NEUROLOGIC:  Alert and oriented x 3    ASSESSMENT:    1. Coronary artery disease involving native coronary artery of native heart without angina pectoris   2. Hyperlipidemia LDL goal <70   3. Essential hypertension    PLAN:     1.  Chest pain :    Will have her return for a nurse visit ECG and BP check in Jan.  She is having a minor surgical procedure in March   2.  Hyperlipidemia: She has mild hyperlipidemia.  She is on rosuvastatin 10 mg a day.  Coronary calcium score is 19 which places her in the 71st percentile for age/sex matched controls.     3.  Recent episode of pancreatitis:    4.  Hypertension:   We changed her lisinopril to Losartan 50 mg a day.   BMP  In 3 weeks Office visit in 6 months     Medication Adjustments/Labs and Tests Ordered: Current medicines are reviewed at length with the patient today.  Concerns regarding medicines are outlined above.  Orders Placed This Encounter  Procedures  . Basic Metabolic Panel (BMET)   Meds ordered this encounter  Medications  . rosuvastatin (CRESTOR) 10 MG tablet    Sig: Take 1 tablet  (10 mg total) by mouth daily.    Dispense:  90 tablet    Refill:  3    Patient Instructions  Medication Instructions:  Your physician recommends that you continue on your current medications as directed. Please refer to the Current Medication list given to you today.  *If you need a refill on your cardiac medications before your next appointment, please call your pharmacy*   Lab Work: Your physician recommends that you return for lab work in: 3 weeks on Tuesday Nov. 30 You may come in anytime between 7:30 am and 4:30 pm. You do not have to fast.   If you have labs (blood work) drawn today and your tests are completely normal, you will receive your results only by: Marland Kitchen MyChart Message (if you have MyChart) OR . A paper copy in the mail If you have any lab test that is abnormal or we need to change your treatment, we will call you to review the results.   Testing/Procedures: None Ordered    Follow-Up: At [facility], you and your health needs are our priority.  As part of our continuing mission to provide you with exceptional heart care, we have created designated Provider Care Teams.  These Care Teams include your primary Cardiologist (physician) and Advanced Practice Providers (APPs -  Physician Assistants and Nurse Practitioners) who all work together to provide you with the care you need, when you need it.   Your next appointment:   6 month(s)  The format for your  next appointment:   In Person  Provider:   You may see Mertie Moores, MD or one of the following Advanced Practice Providers on your designated Care Team:    Richardson Dopp, PA-C  Robbie Lis, [place]    Other Instructions Your physician recommends that you return for a follow-up appointment on January 12 at 11:00 am for Nurse visit/BP check/EKG      Signed, Mertie Moores, MD  03/26/2020 4:20 PM    [place]

## 2020-03-26 ENCOUNTER — Other Ambulatory Visit: Payer: Self-pay

## 2020-03-26 ENCOUNTER — Ambulatory Visit (INDEPENDENT_AMBULATORY_CARE_PROVIDER_SITE_OTHER): Payer: Managed Care, Other (non HMO) | Admitting: Cardiovascular Disease

## 2020-03-26 ENCOUNTER — Encounter: Payer: Self-pay | Admitting: Cardiovascular Disease

## 2020-03-26 VITALS — BP 114/60 | HR 60 | Ht 63.0 in | Wt 153.2 lb

## 2020-03-26 DIAGNOSIS — I2510 Atherosclerotic heart disease of native coronary artery without angina pectoris: Secondary | ICD-10-CM

## 2020-03-26 DIAGNOSIS — E785 Hyperlipidemia, unspecified: Secondary | ICD-10-CM | POA: Diagnosis not present

## 2020-03-26 DIAGNOSIS — I10 Essential (primary) hypertension: Secondary | ICD-10-CM | POA: Diagnosis not present

## 2020-03-26 MED ORDER — ROSUVASTATIN CALCIUM 10 MG PO TABS: 10.0000 mg | ORAL_TABLET | Freq: Every day | ORAL | 3 refills | Status: DC

## 2020-03-26 NOTE — Patient Instructions (Signed)
Medication Instructions:  Your physician recommends that you continue on your current medications as directed. Please refer to the Current Medication list given to you today.  *If you need a refill on your cardiac medications before your next appointment, please call your pharmacy*   Lab Work: Your physician recommends that you return for lab work in: 3 weeks on Tuesday Nov. 30 You may come in anytime between 7:30 am and 4:30 pm. You do not have to fast.   If you have labs (blood work) drawn today and your tests are completely normal, you will receive your results only by: Marland Kitchen MyChart Message (if you have MyChart) OR . A paper copy in the mail If you have any lab test that is abnormal or we need to change your treatment, we will call you to review the results.   Testing/Procedures: None Ordered    Follow-Up: At [facility], you and your health needs are our priority.  As part of our continuing mission to provide you with exceptional heart care, we have created designated Provider Care Teams.  These Care Teams include your primary Cardiologist (physician) and Advanced Practice Providers (APPs -  Physician Assistants and Nurse Practitioners) who all work together to provide you with the care you need, when you need it.   Your next appointment:   6 month(s)  The format for your next appointment:   In Person  Provider:   You may see Mertie Moores, MD or one of the following Advanced Practice Providers on your designated Care Team:    Richardson Dopp, PA-C  Robbie Lis, [place]    Other Instructions Your physician recommends that you return for a follow-up appointment on January 12 at 11:00 am for Nurse visit/BP check/EKG

## 2020-04-16 ENCOUNTER — Other Ambulatory Visit: Payer: Managed Care, Other (non HMO) | Admitting: *Deleted

## 2020-04-16 ENCOUNTER — Other Ambulatory Visit: Payer: Self-pay

## 2020-04-16 DIAGNOSIS — E785 Hyperlipidemia, unspecified: Secondary | ICD-10-CM

## 2020-04-16 DIAGNOSIS — I2510 Atherosclerotic heart disease of native coronary artery without angina pectoris: Secondary | ICD-10-CM

## 2020-04-16 DIAGNOSIS — I10 Essential (primary) hypertension: Secondary | ICD-10-CM

## 2020-04-17 LAB — BASIC METABOLIC PANEL
BUN/Creatinine Ratio: 17 (ref 12–28)
BUN: 12 mg/dL (ref 8–27)
CO2: 22 mmol/L (ref 20–29)
Calcium: 9.2 mg/dL (ref 8.7–10.3)
Chloride: 109 mmol/L — ABNORMAL HIGH (ref 96–106)
Creatinine, Ser: 0.69 mg/dL (ref 0.57–1.00)
GFR calc Af Amer: 108 mL/min/{1.73_m2} (ref >59)
GFR calc non Af Amer: 94 mL/min/{1.73_m2} (ref >59)
Glucose: 97 mg/dL (ref 65–99)
Potassium: 4.2 mmol/L (ref 3.5–5.2)
Sodium: 146 mmol/L — ABNORMAL HIGH (ref 134–144)

## 2020-04-24 ENCOUNTER — Ambulatory Visit: Payer: Managed Care, Other (non HMO)

## 2020-04-25 ENCOUNTER — Other Ambulatory Visit: Payer: Self-pay | Admitting: Internal Medicine

## 2020-04-25 DIAGNOSIS — Z1231 Encounter for screening mammogram for malignant neoplasm of breast: Secondary | ICD-10-CM

## 2020-05-29 ENCOUNTER — Ambulatory Visit: Payer: Managed Care, Other (non HMO)

## 2020-06-05 ENCOUNTER — Ambulatory Visit: Payer: Managed Care, Other (non HMO)

## 2020-06-13 ENCOUNTER — Other Ambulatory Visit: Payer: Self-pay

## 2020-06-13 ENCOUNTER — Ambulatory Visit (INDEPENDENT_AMBULATORY_CARE_PROVIDER_SITE_OTHER): Payer: Managed Care, Other (non HMO) | Admitting: Nurse Practitioner

## 2020-06-13 VITALS — BP 130/88 | HR 61 | Ht 63.0 in | Wt 151.0 lb

## 2020-06-13 DIAGNOSIS — I2510 Atherosclerotic heart disease of native coronary artery without angina pectoris: Secondary | ICD-10-CM | POA: Diagnosis not present

## 2020-06-13 DIAGNOSIS — I10 Essential (primary) hypertension: Secondary | ICD-10-CM | POA: Diagnosis not present

## 2020-06-13 NOTE — Progress Notes (Unsigned)
1.) Reason for visit: Nurse visit for elective surgery clearance  2.) Name of MD requesting visit: Dr. Acie Fredrickson  3.) H&P: Patient presents for EKG/BP check prior to elective surgery that she plans to have soon. She denies concerns and reports compliance with medications. She reports she is feeling better since she started taking losartan for elevated BP.  4.) ROS related to problem: No concerns at present. Alert and oriented to person/place/time/situation. In no acute distress.  5.) Assessment and plan per MD: Continue current treatment plan. Follow-up with Dr. Acie Fredrickson on Wed. May 11 at 9:00 am

## 2020-06-13 NOTE — Patient Instructions (Addendum)
Medication Instructions:  Your physician recommends that you continue on your current medications as directed. Please refer to the Current Medication list given to you today.  *If you need a refill on your cardiac medications before your next appointment, please call your pharmacy*   Lab Work: None Ordered If you have labs (blood work) drawn today and your tests are completely normal, you will receive your results only by: Marland Kitchen MyChart Message (if you have MyChart) OR . A paper copy in the mail If you have any lab test that is abnormal or we need to change your treatment, we will call you to review the results.   Testing/Procedures: None Ordered   Follow-Up: At [facility], you and your health needs are our priority.  As part of our continuing mission to provide you with exceptional heart care, we have created designated Provider Care Teams.  These Care Teams include your primary Cardiologist (physician) and Advanced Practice Providers (APPs -  Physician Assistants and Nurse Practitioners) who all work together to provide you with the care you need, when you need it.   Your next appointment:   4 month(s) on Wed. May 11 at 9:00 am  The format for your next appointment:   In Person  Provider:   Mertie Moores, MD

## 2020-06-17 ENCOUNTER — Ambulatory Visit: Payer: Managed Care, Other (non HMO)

## 2020-06-17 NOTE — Addendum Note (Signed)
Addended by: Georgiann Cocker on: 06/17/2020 04:31 PM   Modules accepted: Orders

## 2020-07-04 ENCOUNTER — Other Ambulatory Visit (HOSPITAL_COMMUNITY): Payer: Self-pay | Admitting: *Deleted

## 2020-07-05 ENCOUNTER — Ambulatory Visit (HOSPITAL_COMMUNITY)
Admission: RE | Admit: 2020-07-05 | Discharge: 2020-07-05 | Disposition: A | Discharge status: Home / Self Care | Payer: Managed Care, Other (non HMO) | Source: Ambulatory Visit | Attending: Internal Medicine | Admitting: Internal Medicine

## 2020-07-05 ENCOUNTER — Other Ambulatory Visit: Payer: Self-pay

## 2020-07-05 DIAGNOSIS — M810 Age-related osteoporosis without current pathological fracture: Secondary | ICD-10-CM | POA: Insufficient documentation

## 2020-07-05 MED ORDER — ZOLEDRONIC ACID 5 MG/100ML IV SOLN: 5.0000 mg | Freq: Once | INTRAVENOUS | Status: DC

## 2020-07-05 MED ORDER — ZOLEDRONIC ACID 5 MG/100ML IV SOLN
INTRAVENOUS | Status: AC
  Administered 2020-07-05: 5 mg
  Filled 2020-07-05: qty 100

## 2020-07-05 NOTE — Discharge Instructions (Signed)
Zoledronic Acid Injection (Paget's Disease, Osteoporosis) What is this medicine? ZOLEDRONIC ACID (ZOE le dron ik AS id) slows calcium loss from bones. It treats Paget's disease and osteoporosis. It may be used in other people at risk for bone loss. This medicine may be used for other purposes; ask your health care provider or pharmacist if you have questions. COMMON BRAND NAME(S): Reclast, Zometa What should I tell my health care provider before I take this medicine? They need to know if you have any of these conditions:  bleeding disorder  cancer  dental disease  kidney disease  low levels of calcium in the blood  low red blood cell counts  lung or breathing disease (asthma)  receiving steroids like dexamethasone or prednisone  an unusual or allergic reaction to zoledronic acid, other medicines, foods, dyes, or preservatives  pregnant or trying to get pregnant  breast-feeding How should I use this medicine? This drug is injected into a vein. It is given by a health care provider in a hospital or clinic setting. A special MedGuide will be given to you before each treatment. Be sure to read this information carefully each time. Talk to your health care provider about the use of this drug in children. Special care may be needed. Overdosage: If you think you have taken too much of this medicine contact a poison control center or emergency room at once. NOTE: This medicine is only for you. Do not share this medicine with others. What if I miss a dose? Keep appointments for follow-up doses. It is important not to miss your dose. Call your health care provider if you are unable to keep an appointment. What may interact with this medicine?  certain antibiotics given by injection  NSAIDs, medicines for pain and inflammation, like ibuprofen or naproxen  some diuretics like bumetanide, furosemide  teriparatide This list may not describe all possible interactions. Give your health  care provider a list of all the medicines, herbs, non-prescription drugs, or dietary supplements you use. Also tell them if you smoke, drink alcohol, or use illegal drugs. Some items may interact with your medicine. What should I watch for while using this medicine? Visit your health care provider for regular checks on your progress. It may be some time before you see the benefit from this drug. Some people who take this drug have severe bone, joint, or muscle pain. This drug may also increase your risk for jaw problems or a broken thigh bone. Tell your health care provider right away if you have severe pain in your jaw, bones, joints, or muscles. Tell you health care provider if you have any pain that does not go away or that gets worse. You should make sure you get enough calcium and vitamin D while you are taking this drug. Discuss the foods you eat and the vitamins you take with your health care provider. You may need blood work done while you are taking this drug. Tell your dentist and dental surgeon that you are taking this drug. You should not have major dental surgery while on this drug. See your dentist to have a dental exam and fix any dental problems before starting this drug. Take good care of your teeth while on this drug. Make sure you see your dentist for regular follow-up appointments. What side effects may I notice from receiving this medicine? Side effects that you should report to your doctor or health care provider as soon as possible:  allergic reactions (skin rash, itching or   hives; swelling of the face, lips, or tongue)  bone pain  infection (fever, chills, cough, sore throat, pain or trouble passing urine)  jaw pain, especially after dental work  joint pain  kidney injury (trouble passing urine or change in the amount of urine)  low calcium levels (fast heartbeat; muscle cramps or pain; pain, tingling, or numbness in the hands or feet; seizures)  low red blood cell  counts (trouble breathing; feeling faint; lightheaded, falls; unusually weak or tired)  muscle pain  palpitations  redness, blistering, peeling, or loosening of the skin, including inside the mouth Side effects that usually do not require medical attention (report to your doctor or health care provider if they continue or are bothersome):  diarrhea  eye irritation, itching, or pain  fever  general ill feeling or flu-like symptoms  headache  increase in blood pressure  nausea  pain, redness, or irritation at site where injected  stomach pain  upset stomach This list may not describe all possible side effects. Call your doctor for medical advice about side effects. You may report side effects to FDA at 1-800-FDA-1088. Where should I keep my medicine? This drug is given in a hospital or clinic. It will not be stored at home. NOTE: This sheet is a summary. It may not cover all possible information. If you have questions about this medicine, talk to your doctor, pharmacist, or health care provider.  2021 Elsevier/Gold Standard (2019-02-20 11:46:18)   

## 2020-07-17 ENCOUNTER — Ambulatory Visit: Payer: Managed Care, Other (non HMO)

## 2020-09-24 ENCOUNTER — Encounter: Payer: Self-pay | Admitting: Cardiovascular Disease

## 2020-09-24 NOTE — Progress Notes (Signed)
Cardiology Office Note:    Date:  09/25/2020   ID:  Faith [name], DOB [birth year], MRN [id number]  PCP:  Michael Boston, MD  Cardiologist:  Tobenna Needs  Electrophysiologist:  None   Referring MD: Michael Boston, MD   Chief Complaint  Patient presents with  . Coronary Artery Disease  . Chest Pain     previous notes:   Faith [name] is a 65 y.o. female with a hx of supraventricular tachycardia.  I have seen her many years ago.  I saw her several months ago as a home visit when she called complaining of chest pain.  She had been having chest pain primarily centered under her left breast.  There was some left shoulder  discomfort as well.   Radiating to back - . Lasted 1-2 hours.  Then gradually resolved.  Kardia tele did nto reveal any ST changed.  The following day she had right-sided chest discomfort.  Pain there is at this pain lasted all day long. Had some dyspnea but no pleuretic cp   Lipids in June, 2020 showed chol of 225, LDL was 138.  Trigs were 70, HDL was 76   Oct 10, 2019 Faith [name] is seen today for follow up of her chest pain  Coronary CT angio shows mild disease in the LAD.  The LCx and RCA are free of disease.  Coronary calcium score of 19. This was 60 percentile for age and sex matched control. She has been started on Rosuvastatin 10 mg a day  Recent  lipid levels reveal:  Chol = 142 HDL is 83 Trigs are 58 LDL is 47  Oct 10, 2019:  Faith [name] is seen today for follow up of her mild CAD, chest pain and coronary calcium score if 19. Still has some palpitations .  Lipids are reviewed and are great Recently had an episode of pancreatitis.  Liver enz are stable .  Has been on a low fat diet since that time  CT of the abd ( several weeks later) looked ok   Her biological father died in his 69s of colon Walks regularly .      March 26, 2020:  Faith [name] is seen today for follow-up of her mild coronary artery disease, history of chest pain with a coronary calcium score of  19.  She has had some palpitations.  Her blood pressure has been elevated recently.  Pt is having some surgery in March,   Will need an ECG several months prior .  She is at low risk for her surgery .  Has no significant CAD.  Has mild coronary plaque.    Coronary calcium score of 19.   Sep 25, 2020:  Faith [name] is seen today for hx of CAD.  Coronary calcium score  Is 19. Feeling well,  No CP  Lots of allergy issues,  Throat congestion. She did not have her neck lift that she was planning last year.   Her allergy cough precluded her from getting the surgery .  Might eat a bit more salt than she should  Doing cardio exercise  Labs from her primary medical doctor reveals a total cholesterol of 161.  The HDL is 83.  LDL is 63.  Triglyceride level 74.  Past Medical History:  Diagnosis Date  . Allergy    spring   . Anemia    yrs ago   . Arthritis   . Family history of colon cancer in father   . Gastric ulcer   .  GERD (gastroesophageal reflux disease)   . Hypertension   . MVP (mitral valve prolapse)   . Osteopenia     Past Surgical History:  Procedure Laterality Date  . BREAST EXCISIONAL BIOPSY Right 1986  . BREAST REDUCTION SURGERY    . CHOLECYSTECTOMY    . COLONOSCOPY    . GASTRIC BYPASS    . KNEE ARTHROSCOPY     right  . REDUCTION MAMMAPLASTY Bilateral 2001  . SKIN CANCER EXCISION    . TEMPOROMANDIBULAR JOINT SURGERY    . TONSILLECTOMY AND ADENOIDECTOMY    . TUBAL LIGATION    . UPPER GASTROINTESTINAL ENDOSCOPY      Current Medications: Current Meds  Medication Sig  . AMBULATORY NON FORMULARY MEDICATION Probioslim Extra Strength Probiotic, fat burner and bloat Tae 2 tablet by mouth daily  . AUVI-Q 0.3 MG/0.3ML SOAJ injection Inject 0.3 mg into the muscle as needed.  Marland Kitchen azelastine (ASTELIN) 0.1 % nasal spray SMARTSIG:1-2 Spray(s) Both Nares Twice Daily PRN  . B Complex Vitamins (VITAMIN B COMPLEX PO) Take 1 tablet by mouth daily.  Marland Kitchen CALCIUM PO Take 1 tablet by mouth  daily. Adult chewable calcium supplement  . Ferrous Sulfate (IRON PO) Take 1 tablet by mouth daily.  Marland Kitchen levocetirizine (XYZAL) 5 MG tablet Take 1 tablet by mouth daily.  Marland Kitchen lisinopril (ZESTRIL) 20 MG tablet TK 1 T PO D  . losartan (COZAAR) 50 MG tablet Take 1 tablet (50 mg total) by mouth daily.  . Multiple Vitamin (MULITIVITAMIN WITH MINERALS) TABS Take 3 tablets by mouth daily. Adult gummy multi-vitamins  . Probiotic Product (PROBIOTIC DAILY PO) Take 1 capsule daily by mouth.  Marland Kitchen VITAMIN D PO Take 1 tablet by mouth daily.     Allergies:   Patient has no known allergies.   Social History   Socioeconomic History  . Marital status: Married    Spouse name: Not on file  . Number of children: 2  . Years of education: Not on file  . Highest education level: Not on file  Occupational History  . Occupation: Mental Health Therapist  Tobacco Use  . Smoking status: Never Smoker  . Smokeless tobacco: Never Used  Vaping Use  . Vaping Use: Never used  Substance and Sexual Activity  . Alcohol use: Yes    Alcohol/week: 0.0 standard drinks    Comment: rare  . Drug use: No  . Sexual activity: Not on file  Other Topics Concern  . Not on file  Social History Narrative  . Not on file   Social Determinants of Health   Financial Resource Strain: Not on file  Food Insecurity: Not on file  Transportation Needs: Not on file  Physical Activity: Not on file  Stress: Not on file  Social Connections: Not on file     Family History: The patient's family history includes Colon cancer (age of onset: 48) in her father. She was adopted.  ROS:   Please see the history of present illness.     All other systems reviewed and are negative.  EKGs/Labs/Other Studies Reviewed:    The following studies were reviewed today:   EKG:     Recent Labs: 11/07/2019: ALT 15; Hemoglobin 13.2; Platelets 183.0 04/16/2020: BUN 12; Creatinine, Ser 0.69; Potassium 4.2; Sodium 146  Recent Lipid Panel     Component Value Date/Time   CHOL 142 09/22/2019 0947   TRIG 58 09/22/2019 0947   HDL 83 09/22/2019 0947   CHOLHDL 1.7 09/22/2019 0947   LDLCALC 47 09/22/2019  0947    Physical Exam:    Physical Exam: Blood pressure 130/88, pulse (!) 50, height 5\' 3"  (1.6 m), weight 186 lb 12.8 oz (84.7 kg), SpO2 97 %.  GEN:  Middle age female, NAD  HEENT: Normal NECK: No JVD; No carotid bruits LYMPHATICS: No lymphadenopathy CARDIAC: RRR , no murmurs, rubs, gallops RESPIRATORY:  Clear to auscultation without rales, wheezing or rhonchi  ABDOMEN: Soft, non-tender, non-distended MUSCULOSKELETAL:  No edema; No deformity  SKIN: Warm and dry NEUROLOGIC:  Alert and oriented x 3     ASSESSMENT:    No diagnosis found. PLAN:     1.  Chest pain :      No further episodes of chest pain.  2.  Hyperlipidemia: She has mild hyperlipidemia.  She is on rosuvastatin 10 mg a day.  Coronary calcium score is 19 which places her in the 71st percentile for age/sex matched controls.    Her lipids were checked at her primary medical doctor's office.  Her labs look good.  3.  Recent episode of pancreatitis:    4.  Hypertension:    Diastolic blood pressure is very minimally elevated.  She still eats a little some salty foods.  I encouraged her to work on reduction of her salt intake and to increase her cardio exercise.    Medication Adjustments/Labs and Tests Ordered: Current medicines are reviewed at length with the patient today.  Concerns regarding medicines are outlined above.  No orders of the defined types were placed in this encounter.  No orders of the defined types were placed in this encounter.   Patient Instructions  Medication Instructions:  Your physician recommends that you continue on your current medications as directed. Please refer to the Current Medication list given to you today.  *If you need a refill on your cardiac medications before your next appointment, please call your  pharmacy*   Lab Work: none If you have labs (blood work) drawn today and your tests are completely normal, you will receive your results only by: Marland Kitchen MyChart Message (if you have MyChart) OR . A paper copy in the mail If you have any lab test that is abnormal or we need to change your treatment, we will call you to review the results.   Testing/Procedures: none   Follow-Up: At [facility], you and your health needs are our priority.  As part of our continuing mission to provide you with exceptional heart care, we have created designated Provider Care Teams.  These Care Teams include your primary Cardiologist (physician) and Advanced Practice Providers (APPs -  Physician Assistants and Nurse Practitioners) who all work together to provide you with the care you need, when you need it.   Your next appointment:   1 year(s)  The format for your next appointment:   In Person  Provider:   You may see Mertie Moores, MD or one of the following Advanced Practice Providers on your designated Care Team:    Richardson Dopp, PA-C  Robbie Lis, [place]      Signed, Mertie Moores, MD  09/25/2020 9:57 AM    [place]

## 2020-09-25 ENCOUNTER — Ambulatory Visit (INDEPENDENT_AMBULATORY_CARE_PROVIDER_SITE_OTHER): Payer: Managed Care, Other (non HMO) | Admitting: Cardiovascular Disease

## 2020-09-25 ENCOUNTER — Other Ambulatory Visit: Payer: Self-pay

## 2020-09-25 ENCOUNTER — Encounter: Payer: Self-pay | Admitting: Cardiovascular Disease

## 2020-09-25 VITALS — BP 130/88 | HR 50 | Ht 63.0 in | Wt 186.8 lb

## 2020-09-25 DIAGNOSIS — E782 Mixed hyperlipidemia: Secondary | ICD-10-CM

## 2020-09-25 DIAGNOSIS — I2510 Atherosclerotic heart disease of native coronary artery without angina pectoris: Secondary | ICD-10-CM

## 2020-09-25 DIAGNOSIS — I2584 Coronary atherosclerosis due to calcified coronary lesion: Secondary | ICD-10-CM

## 2020-09-25 NOTE — Patient Instructions (Signed)

## 2020-12-23 ENCOUNTER — Other Ambulatory Visit: Payer: Self-pay | Admitting: Internal Medicine

## 2020-12-23 DIAGNOSIS — M810 Age-related osteoporosis without current pathological fracture: Secondary | ICD-10-CM

## 2021-01-02 ENCOUNTER — Other Ambulatory Visit: Payer: Self-pay | Admitting: Internal Medicine

## 2021-01-02 DIAGNOSIS — Z1231 Encounter for screening mammogram for malignant neoplasm of breast: Secondary | ICD-10-CM

## 2021-01-02 DIAGNOSIS — M810 Age-related osteoporosis without current pathological fracture: Secondary | ICD-10-CM

## 2021-02-18 ENCOUNTER — Ambulatory Visit
Admission: RE | Admit: 2021-02-18 | Discharge: 2021-02-18 | Disposition: A | Discharge status: Home / Self Care | Payer: Managed Care, Other (non HMO) | Source: Ambulatory Visit | Attending: Internal Medicine | Admitting: Internal Medicine

## 2021-02-18 ENCOUNTER — Other Ambulatory Visit: Payer: Self-pay

## 2021-02-18 DIAGNOSIS — Z1231 Encounter for screening mammogram for malignant neoplasm of breast: Secondary | ICD-10-CM

## 2021-03-14 ENCOUNTER — Other Ambulatory Visit: Payer: Self-pay | Admitting: Cardiovascular Disease

## 2021-05-18 DIAGNOSIS — T148XXA Other injury of unspecified body region, initial encounter: Secondary | ICD-10-CM

## 2021-05-18 HISTORY — DX: Other injury of unspecified body region, initial encounter: T14.8XXA

## 2021-06-25 ENCOUNTER — Other Ambulatory Visit: Payer: Managed Care, Other (non HMO)

## 2021-07-17 DIAGNOSIS — J301 Allergic rhinitis due to pollen: Secondary | ICD-10-CM | POA: Diagnosis not present

## 2021-07-17 DIAGNOSIS — J3089 Other allergic rhinitis: Secondary | ICD-10-CM | POA: Diagnosis not present

## 2021-07-24 DIAGNOSIS — J301 Allergic rhinitis due to pollen: Secondary | ICD-10-CM | POA: Diagnosis not present

## 2021-07-25 DIAGNOSIS — J3089 Other allergic rhinitis: Secondary | ICD-10-CM | POA: Diagnosis not present

## 2021-07-28 DIAGNOSIS — J3089 Other allergic rhinitis: Secondary | ICD-10-CM | POA: Diagnosis not present

## 2021-07-28 DIAGNOSIS — J301 Allergic rhinitis due to pollen: Secondary | ICD-10-CM | POA: Diagnosis not present

## 2021-07-29 DIAGNOSIS — Z79899 Other long term (current) drug therapy: Secondary | ICD-10-CM | POA: Diagnosis not present

## 2021-07-29 DIAGNOSIS — M810 Age-related osteoporosis without current pathological fracture: Secondary | ICD-10-CM | POA: Diagnosis not present

## 2021-08-14 ENCOUNTER — Other Ambulatory Visit (HOSPITAL_COMMUNITY): Payer: Self-pay

## 2021-08-14 DIAGNOSIS — J3089 Other allergic rhinitis: Secondary | ICD-10-CM | POA: Diagnosis not present

## 2021-08-14 DIAGNOSIS — J301 Allergic rhinitis due to pollen: Secondary | ICD-10-CM | POA: Diagnosis not present

## 2021-08-14 DIAGNOSIS — J3081 Allergic rhinitis due to animal (cat) (dog) hair and dander: Secondary | ICD-10-CM | POA: Diagnosis not present

## 2021-08-15 ENCOUNTER — Encounter (HOSPITAL_COMMUNITY)
Admission: RE | Admit: 2021-08-15 | Discharge: 2021-08-15 | Disposition: A | Discharge status: Home / Self Care | Payer: Self-pay | Source: Ambulatory Visit | Attending: Internal Medicine | Admitting: Internal Medicine

## 2021-08-15 DIAGNOSIS — M810 Age-related osteoporosis without current pathological fracture: Secondary | ICD-10-CM | POA: Insufficient documentation

## 2021-08-15 MED ORDER — ZOLEDRONIC ACID 5 MG/100ML IV SOLN: 5.0000 mg | Freq: Once | INTRAVENOUS | Status: AC

## 2021-08-15 MED ORDER — ZOLEDRONIC ACID 5 MG/100ML IV SOLN
INTRAVENOUS | Status: AC
  Administered 2021-08-15: 5 mg via INTRAVENOUS
  Filled 2021-08-15: qty 100

## 2021-08-27 DIAGNOSIS — J301 Allergic rhinitis due to pollen: Secondary | ICD-10-CM | POA: Diagnosis not present

## 2021-08-27 DIAGNOSIS — J3089 Other allergic rhinitis: Secondary | ICD-10-CM | POA: Diagnosis not present

## 2021-08-27 DIAGNOSIS — J3081 Allergic rhinitis due to animal (cat) (dog) hair and dander: Secondary | ICD-10-CM | POA: Diagnosis not present

## 2021-09-09 DIAGNOSIS — J3089 Other allergic rhinitis: Secondary | ICD-10-CM | POA: Diagnosis not present

## 2021-09-09 DIAGNOSIS — J3081 Allergic rhinitis due to animal (cat) (dog) hair and dander: Secondary | ICD-10-CM | POA: Diagnosis not present

## 2021-09-09 DIAGNOSIS — J301 Allergic rhinitis due to pollen: Secondary | ICD-10-CM | POA: Diagnosis not present

## 2021-09-16 DIAGNOSIS — D2271 Melanocytic nevi of right lower limb, including hip: Secondary | ICD-10-CM | POA: Diagnosis not present

## 2021-09-16 DIAGNOSIS — D1801 Hemangioma of skin and subcutaneous tissue: Secondary | ICD-10-CM | POA: Diagnosis not present

## 2021-09-16 DIAGNOSIS — L821 Other seborrheic keratosis: Secondary | ICD-10-CM | POA: Diagnosis not present

## 2021-09-16 DIAGNOSIS — Z85820 Personal history of malignant melanoma of skin: Secondary | ICD-10-CM | POA: Diagnosis not present

## 2021-09-16 DIAGNOSIS — D485 Neoplasm of uncertain behavior of skin: Secondary | ICD-10-CM | POA: Diagnosis not present

## 2021-09-16 DIAGNOSIS — D225 Melanocytic nevi of trunk: Secondary | ICD-10-CM | POA: Diagnosis not present

## 2021-09-23 DIAGNOSIS — R0981 Nasal congestion: Secondary | ICD-10-CM | POA: Diagnosis not present

## 2021-09-23 DIAGNOSIS — J069 Acute upper respiratory infection, unspecified: Secondary | ICD-10-CM | POA: Diagnosis not present

## 2021-09-24 DIAGNOSIS — J3089 Other allergic rhinitis: Secondary | ICD-10-CM | POA: Diagnosis not present

## 2021-09-24 DIAGNOSIS — J301 Allergic rhinitis due to pollen: Secondary | ICD-10-CM | POA: Diagnosis not present

## 2021-09-24 DIAGNOSIS — J3081 Allergic rhinitis due to animal (cat) (dog) hair and dander: Secondary | ICD-10-CM | POA: Diagnosis not present

## 2021-10-09 DIAGNOSIS — J3081 Allergic rhinitis due to animal (cat) (dog) hair and dander: Secondary | ICD-10-CM | POA: Diagnosis not present

## 2021-10-09 DIAGNOSIS — J301 Allergic rhinitis due to pollen: Secondary | ICD-10-CM | POA: Diagnosis not present

## 2021-10-09 DIAGNOSIS — J3089 Other allergic rhinitis: Secondary | ICD-10-CM | POA: Diagnosis not present

## 2021-10-17 DIAGNOSIS — J301 Allergic rhinitis due to pollen: Secondary | ICD-10-CM | POA: Diagnosis not present

## 2021-10-17 DIAGNOSIS — J3089 Other allergic rhinitis: Secondary | ICD-10-CM | POA: Diagnosis not present

## 2021-10-23 ENCOUNTER — Other Ambulatory Visit: Payer: Self-pay

## 2021-10-23 MED ORDER — ROSUVASTATIN CALCIUM 10 MG PO TABS: 10.0000 mg | ORAL_TABLET | Freq: Every day | ORAL | 0 refills | Status: DC

## 2021-10-30 DIAGNOSIS — J3081 Allergic rhinitis due to animal (cat) (dog) hair and dander: Secondary | ICD-10-CM | POA: Diagnosis not present

## 2021-10-30 DIAGNOSIS — J3089 Other allergic rhinitis: Secondary | ICD-10-CM | POA: Diagnosis not present

## 2021-10-30 DIAGNOSIS — J301 Allergic rhinitis due to pollen: Secondary | ICD-10-CM | POA: Diagnosis not present

## 2021-11-13 ENCOUNTER — Ambulatory Visit
Admission: RE | Admit: 2021-11-13 | Discharge: 2021-11-13 | Disposition: A | Discharge status: Home / Self Care | Payer: BLUE CROSS/BLUE SHIELD | Source: Ambulatory Visit | Attending: Internal Medicine | Admitting: Internal Medicine

## 2021-11-13 DIAGNOSIS — M810 Age-related osteoporosis without current pathological fracture: Secondary | ICD-10-CM

## 2021-11-20 DIAGNOSIS — J3081 Allergic rhinitis due to animal (cat) (dog) hair and dander: Secondary | ICD-10-CM | POA: Diagnosis not present

## 2021-11-20 DIAGNOSIS — J301 Allergic rhinitis due to pollen: Secondary | ICD-10-CM | POA: Diagnosis not present

## 2021-11-20 DIAGNOSIS — J3089 Other allergic rhinitis: Secondary | ICD-10-CM | POA: Diagnosis not present

## 2021-11-21 ENCOUNTER — Other Ambulatory Visit: Payer: Self-pay | Admitting: Cardiovascular Disease

## 2021-11-21 MED ORDER — ROSUVASTATIN CALCIUM 10 MG PO TABS: 10.0000 mg | ORAL_TABLET | Freq: Every day | ORAL | 0 refills | Status: DC

## 2021-11-21 NOTE — Addendum Note (Signed)
Addended by: Carter Kitten D on: 11/21/2021 01:20 PM   Modules accepted: Orders

## 2021-11-21 NOTE — Telephone Encounter (Signed)
Pt's medication was sent to pt's pharmacy as requested. Confirmation received.  °

## 2021-11-23 ENCOUNTER — Encounter: Payer: Self-pay | Admitting: Cardiovascular Disease

## 2021-11-23 NOTE — Progress Notes (Signed)
Cardiology Office Note:    Date:  11/24/2021   ID:  Faith [name], DOB Aug 25, 1955, MRN [id number]  PCP:  Melida Quitter, MD  Cardiologist:  Elliemae Braman  Electrophysiologist:  None   Referring MD: Melida Quitter, MD   Chief Complaint  Patient presents with   coronary artery calcifications     previous notes:   Faith [name] is a 66 y.o. female with a hx of supraventricular tachycardia.  I have seen her many years ago.  I saw her several months ago as a home visit when she called complaining of chest pain.  She had been having chest pain primarily centered under her left breast.  There was some left shoulder  discomfort as well.   Radiating to back - . Lasted 1-2 hours.  Then gradually resolved.  Kardia tele did nto reveal any ST changed.  The following day she had right-sided chest discomfort.  Pain there is at this pain lasted all day long. Had some dyspnea but no pleuretic cp   Lipids in June, 2020 showed chol of 225, LDL was 138.  Trigs were 55, HDL was 76   Oct 10, 2019 Faith [name] is seen today for follow up of her chest pain  Coronary CT angio shows mild disease in the LAD.  The LCx and RCA are free of disease.  Coronary calcium score of 19. This was 56 percentile for age and sex matched control. She has been started on Rosuvastatin 10 mg a day  Recent  lipid levels reveal:  Chol = 142 HDL is 83 Trigs are 58 LDL is 47  Oct 10, 2019:  Faith [name] is seen today for follow up of her mild CAD, chest pain and coronary calcium score if 19. Still has some palpitations .  Lipids are reviewed and are great Recently had an episode of pancreatitis.  Liver enz are stable .  Has been on a low fat diet since that time  CT of the abd ( several weeks later) looked ok   Her biological father died in his 73s of colon Walks regularly .      March 26, 2020:  Faith [name] is seen today for follow-up of her mild coronary artery disease, history of chest pain with a coronary calcium score of 19.  She  has had some palpitations.  Her blood pressure has been elevated recently.  Pt is having some surgery in March,   Will need an ECG several months prior .  She is at low risk for her surgery .  Has no significant CAD.  Has mild coronary plaque.    Coronary calcium score of 19.   Sep 25, 2020:  Faith [name] is seen today for hx of CAD.  Coronary calcium score  Is 19. Feeling well,  No CP  Lots of allergy issues,  Throat congestion. She did not have her neck lift that she was planning last year.   Her allergy cough precluded her from getting the surgery .  Might eat a bit more salt than she should  Doing cardio exercise  Labs from her primary medical doctor reveals a total cholesterol of 161.  The HDL is 83.  LDL is 63.  Triglyceride level 74.  November 24, 2021 Faith [name] is seen for follow up of her CAD Coronary CTA fro Feb, 2021 showed:  RCA : normal LM: normal LAD:  mild calcified plaque, minimal stenosis LCx: normal   Coronary calcium score of 19. This was 12 percentile for age  and sex matched control.  Past Medical History:  Diagnosis Date   Allergy    spring    Anemia    yrs ago    Arthritis    Family history of colon cancer in father    Gastric ulcer    GERD (gastroesophageal reflux disease)    Hypertension    MVP (mitral valve prolapse)    Osteopenia     Past Surgical History:  Procedure Laterality Date   BREAST EXCISIONAL BIOPSY Right 1986   BREAST REDUCTION SURGERY     CHOLECYSTECTOMY     COLONOSCOPY     GASTRIC BYPASS     KNEE ARTHROSCOPY     right   REDUCTION MAMMAPLASTY Bilateral 2001   SKIN CANCER EXCISION     TEMPOROMANDIBULAR JOINT SURGERY     TONSILLECTOMY AND ADENOIDECTOMY     TUBAL LIGATION     UPPER GASTROINTESTINAL ENDOSCOPY      Current Medications: Current Meds  Medication Sig   AMBULATORY NON FORMULARY MEDICATION Probioslim Extra Strength Probiotic, fat burner and bloat Tae 2 tablet by mouth daily   AUVI-Q 0.3 MG/0.3ML SOAJ injection Inject 0.3  mg into the muscle as needed.   B Complex Vitamins (VITAMIN B COMPLEX PO) Take 1 tablet by mouth daily.   CALCIUM PO Take 1 tablet by mouth daily. Adult chewable calcium supplement   Ferrous Sulfate (IRON PO) Take 1 tablet by mouth daily.   levocetirizine (XYZAL) 5 MG tablet Take 1 tablet by mouth daily.   lisinopril (ZESTRIL) 20 MG tablet TK 1 T PO D   Multiple Vitamin (MULITIVITAMIN WITH MINERALS) TABS Take 3 tablets by mouth daily. Adult gummy multi-vitamins   omeprazole (PRILOSEC) 20 MG capsule Take 20 mg by mouth daily.   Probiotic Product (PROBIOTIC DAILY PO) Take 1 capsule daily by mouth.   rosuvastatin (CRESTOR) 20 MG tablet Take 1 tablet (20 mg total) by mouth daily.   VITAMIN D PO Take 1 tablet by mouth daily.   [DISCONTINUED] losartan (COZAAR) 50 MG tablet TAKE 1 TABLET(50 MG) BY MOUTH DAILY   [DISCONTINUED] rosuvastatin (CRESTOR) 10 MG tablet Take 1 tablet (10 mg total) by mouth daily.     Allergies:   Patient has no known allergies.   Social History   Socioeconomic History   Marital status: Married    Spouse name: Not on file   Number of children: 2   Years of education: Not on file   Highest education level: Not on file  Occupational History   Occupation: Mental Health Therapist  Tobacco Use   Smoking status: Never   Smokeless tobacco: Never  Vaping Use   Vaping Use: Never used  Substance and Sexual Activity   Alcohol use: Yes    Alcohol/week: 0.0 standard drinks of alcohol    Comment: rare   Drug use: No   Sexual activity: Not on file  Other Topics Concern   Not on file  Social History Narrative   Not on file   Social Determinants of Health   Financial Resource Strain: Not on file  Food Insecurity: Not on file  Transportation Needs: Not on file  Physical Activity: Not on file  Stress: Not on file  Social Connections: Not on file     Family History: The patient's family history includes Colon cancer (age of onset: 49) in her father. She was  adopted.  ROS:   Please see the history of present illness.     All other systems reviewed and are  negative.  EKGs/Labs/Other Studies Reviewed:    The following studies were reviewed today:   EKG:     Recent Labs: No results found for requested labs within last 365 days.  Recent Lipid Panel    Component Value Date/Time   CHOL 142 09/22/2019 0947   TRIG 58 09/22/2019 0947   HDL 83 09/22/2019 0947   CHOLHDL 1.7 09/22/2019 0947   LDLCALC 47 09/22/2019 0947    Physical Exam:    Physical Exam: Blood pressure 132/84, pulse (!) 57, height 5\' 3"  (1.6 m), weight 163 lb (73.9 kg), SpO2 97 %.  GEN:  Well nourished, well developed in no acute distress HEENT: Normal NECK: No JVD; No carotid bruits LYMPHATICS: No lymphadenopathy CARDIAC: RRR , no murmurs, rubs, gallops RESPIRATORY:  Clear to auscultation without rales, wheezing or rhonchi  ABDOMEN: Soft, non-tender, non-distended MUSCULOSKELETAL:  No edema; No deformity  SKIN: Warm and dry NEUROLOGIC:  Alert and oriented x 3      ASSESSMENT:    1. Mixed hyperlipidemia   2. Coronary artery calcification   3. Medication management    PLAN:     1.  Chest pain :      no further episodes of CP    2.  Hyperlipidemia: LDL is 86.   I would like her for hLDL to be between 50-70 since she she coronary artery calcifications.  3.  Recent episode of pancreatitis:    4.  Hypertension:   BP is well controlled.      Medication Adjustments/Labs and Tests Ordered: Current medicines are reviewed at length with the patient today.  Concerns regarding medicines are outlined above.  Orders Placed This Encounter  Procedures   Basic metabolic panel   ALT   Lipid panel   EKG 12-Lead   Meds ordered this encounter  Medications   losartan (COZAAR) 50 MG tablet    Sig: TAKE 1 TABLET(50 MG) BY MOUTH DAILY    Dispense:  90 tablet    Refill:  3   rosuvastatin (CRESTOR) 20 MG tablet    Sig: Take 1 tablet (20 mg total) by mouth daily.     Dispense:  90 tablet    Refill:  3    Dose increase    Patient Instructions  Medication Instructions:  START Rosuvastatin 20mg  daily *If you need a refill on your cardiac medications before your next appointment, please call your pharmacy*   Lab Work: LIPIDS, ALT, BMET in 3 months If you have labs (blood work) drawn today and your tests are completely normal, you will receive your results only by: MyChart Message (if you have MyChart) OR A paper copy in the mail If you have any lab test that is abnormal or we need to change your treatment, we will call you to review the results.   Testing/Procedures: NONE   Follow-Up: At [facility], you and your health needs are our priority.  As part of our continuing mission to provide you with exceptional heart care, we have created designated Provider Care Teams.  These Care Teams include your primary Cardiologist (physician) and Advanced Practice Providers (APPs -  Physician Assistants and Nurse Practitioners) who all work together to provide you with the care you need, when you need it.  Your next appointment:   1 year(s)  The format for your next appointment:   In Person  Provider:   Suzzanne Cloud, or Teran Daughenbaugh {    Important Information About Sugar  Signed, Kristeen Miss, MD  11/24/2021 6:10 PM    [facility]

## 2021-11-24 ENCOUNTER — Encounter: Payer: Self-pay | Admitting: Cardiovascular Disease

## 2021-11-24 ENCOUNTER — Ambulatory Visit (INDEPENDENT_AMBULATORY_CARE_PROVIDER_SITE_OTHER): Payer: BC Managed Care – PPO | Admitting: Cardiovascular Disease

## 2021-11-24 VITALS — BP 132/84 | HR 57 | Ht 63.0 in | Wt 163.0 lb

## 2021-11-24 DIAGNOSIS — I2584 Coronary atherosclerosis due to calcified coronary lesion: Secondary | ICD-10-CM

## 2021-11-24 DIAGNOSIS — I2510 Atherosclerotic heart disease of native coronary artery without angina pectoris: Secondary | ICD-10-CM | POA: Diagnosis not present

## 2021-11-24 DIAGNOSIS — E782 Mixed hyperlipidemia: Secondary | ICD-10-CM | POA: Diagnosis not present

## 2021-11-24 DIAGNOSIS — Z79899 Other long term (current) drug therapy: Secondary | ICD-10-CM

## 2021-11-24 MED ORDER — ROSUVASTATIN CALCIUM 20 MG PO TABS: 20.0000 mg | ORAL_TABLET | Freq: Every day | ORAL | 3 refills | Status: DC

## 2021-11-24 MED ORDER — LOSARTAN POTASSIUM 50 MG PO TABS: ORAL_TABLET | ORAL | 3 refills | Status: DC

## 2021-11-24 NOTE — Patient Instructions (Signed)
Medication Instructions:  START Rosuvastatin '20mg'$  daily *If you need a refill on your cardiac medications before your next appointment, please call your pharmacy*   Lab Work: LIPIDS, ALT, BMET in 3 months If you have labs (blood work) drawn today and your tests are completely normal, you will receive your results only by: [place] (if you have MyChart) OR A paper copy in the mail If you have any lab test that is abnormal or we need to change your treatment, we will call you to review the results.   Testing/Procedures: NONE   Follow-Up: At [facility], you and your health needs are our priority.  As part of our continuing mission to provide you with exceptional heart care, we have created designated Provider Care Teams.  These Care Teams include your primary Cardiologist (physician) and Advanced Practice Providers (APPs -  Physician Assistants and Nurse Practitioners) who all work together to provide you with the care you need, when you need it.  Your next appointment:   1 year(s)  The format for your next appointment:   In Person  Provider:   Ronn Melena, or [place] {    Important Information About Sugar

## 2021-12-04 DIAGNOSIS — J3081 Allergic rhinitis due to animal (cat) (dog) hair and dander: Secondary | ICD-10-CM | POA: Diagnosis not present

## 2021-12-04 DIAGNOSIS — J3089 Other allergic rhinitis: Secondary | ICD-10-CM | POA: Diagnosis not present

## 2021-12-04 DIAGNOSIS — J301 Allergic rhinitis due to pollen: Secondary | ICD-10-CM | POA: Diagnosis not present

## 2021-12-19 DIAGNOSIS — J301 Allergic rhinitis due to pollen: Secondary | ICD-10-CM | POA: Diagnosis not present

## 2021-12-19 DIAGNOSIS — J3081 Allergic rhinitis due to animal (cat) (dog) hair and dander: Secondary | ICD-10-CM | POA: Diagnosis not present

## 2021-12-19 DIAGNOSIS — J3089 Other allergic rhinitis: Secondary | ICD-10-CM | POA: Diagnosis not present

## 2021-12-22 ENCOUNTER — Other Ambulatory Visit (HOSPITAL_COMMUNITY): Payer: Self-pay | Admitting: *Deleted

## 2021-12-23 ENCOUNTER — Ambulatory Visit (HOSPITAL_COMMUNITY)
Admission: RE | Admit: 2021-12-23 | Discharge: 2021-12-23 | Disposition: A | Discharge status: Home / Self Care | Payer: BC Managed Care – PPO | Source: Ambulatory Visit | Attending: Internal Medicine | Admitting: Internal Medicine

## 2021-12-23 DIAGNOSIS — M810 Age-related osteoporosis without current pathological fracture: Secondary | ICD-10-CM | POA: Diagnosis not present

## 2021-12-23 MED ORDER — DENOSUMAB 60 MG/ML ~~LOC~~ SOSY
PREFILLED_SYRINGE | SUBCUTANEOUS | Status: AC
  Filled 2021-12-23: qty 1

## 2021-12-23 MED ORDER — DENOSUMAB 60 MG/ML ~~LOC~~ SOSY
60.0000 mg | PREFILLED_SYRINGE | Freq: Once | SUBCUTANEOUS | Status: AC
  Administered 2021-12-23: 60 mg via SUBCUTANEOUS

## 2021-12-24 ENCOUNTER — Other Ambulatory Visit: Payer: Self-pay

## 2021-12-24 MED ORDER — ROSUVASTATIN CALCIUM 20 MG PO TABS: 20.0000 mg | ORAL_TABLET | Freq: Every day | ORAL | 3 refills | Status: DC

## 2021-12-24 MED ORDER — LOSARTAN POTASSIUM 50 MG PO TABS: ORAL_TABLET | ORAL | 3 refills | Status: DC

## 2021-12-29 DIAGNOSIS — J3089 Other allergic rhinitis: Secondary | ICD-10-CM | POA: Diagnosis not present

## 2021-12-29 DIAGNOSIS — J3081 Allergic rhinitis due to animal (cat) (dog) hair and dander: Secondary | ICD-10-CM | POA: Diagnosis not present

## 2021-12-29 DIAGNOSIS — J301 Allergic rhinitis due to pollen: Secondary | ICD-10-CM | POA: Diagnosis not present

## 2021-12-30 DIAGNOSIS — M810 Age-related osteoporosis without current pathological fracture: Secondary | ICD-10-CM | POA: Diagnosis not present

## 2021-12-30 DIAGNOSIS — E785 Hyperlipidemia, unspecified: Secondary | ICD-10-CM | POA: Diagnosis not present

## 2021-12-31 DIAGNOSIS — Z01419 Encounter for gynecological examination (general) (routine) without abnormal findings: Secondary | ICD-10-CM | POA: Diagnosis not present

## 2021-12-31 DIAGNOSIS — Z6828 Body mass index (BMI) 28.0-28.9, adult: Secondary | ICD-10-CM | POA: Diagnosis not present

## 2022-01-06 DIAGNOSIS — I2510 Atherosclerotic heart disease of native coronary artery without angina pectoris: Secondary | ICD-10-CM | POA: Diagnosis not present

## 2022-01-06 DIAGNOSIS — Z1339 Encounter for screening examination for other mental health and behavioral disorders: Secondary | ICD-10-CM | POA: Diagnosis not present

## 2022-01-06 DIAGNOSIS — Z1331 Encounter for screening for depression: Secondary | ICD-10-CM | POA: Diagnosis not present

## 2022-01-06 DIAGNOSIS — Z0000 Encounter for general adult medical examination without abnormal findings: Secondary | ICD-10-CM | POA: Diagnosis not present

## 2022-01-13 DIAGNOSIS — J3089 Other allergic rhinitis: Secondary | ICD-10-CM | POA: Diagnosis not present

## 2022-01-13 DIAGNOSIS — J301 Allergic rhinitis due to pollen: Secondary | ICD-10-CM | POA: Diagnosis not present

## 2022-01-13 DIAGNOSIS — J3081 Allergic rhinitis due to animal (cat) (dog) hair and dander: Secondary | ICD-10-CM | POA: Diagnosis not present

## 2022-01-21 DIAGNOSIS — J3081 Allergic rhinitis due to animal (cat) (dog) hair and dander: Secondary | ICD-10-CM | POA: Diagnosis not present

## 2022-01-21 DIAGNOSIS — J3089 Other allergic rhinitis: Secondary | ICD-10-CM | POA: Diagnosis not present

## 2022-01-21 DIAGNOSIS — J301 Allergic rhinitis due to pollen: Secondary | ICD-10-CM | POA: Diagnosis not present

## 2022-01-26 DIAGNOSIS — J3089 Other allergic rhinitis: Secondary | ICD-10-CM | POA: Diagnosis not present

## 2022-01-26 DIAGNOSIS — J301 Allergic rhinitis due to pollen: Secondary | ICD-10-CM | POA: Diagnosis not present

## 2022-01-29 ENCOUNTER — Ambulatory Visit (INDEPENDENT_AMBULATORY_CARE_PROVIDER_SITE_OTHER): Payer: BC Managed Care – PPO | Admitting: Neurology

## 2022-01-29 ENCOUNTER — Encounter: Payer: Self-pay | Admitting: Neurology

## 2022-01-29 VITALS — BP 144/86 | HR 55 | Ht 63.0 in | Wt 163.4 lb

## 2022-01-29 DIAGNOSIS — M542 Cervicalgia: Secondary | ICD-10-CM | POA: Diagnosis not present

## 2022-01-29 DIAGNOSIS — R0683 Snoring: Secondary | ICD-10-CM | POA: Diagnosis not present

## 2022-01-29 MED ORDER — DULOXETINE HCL 30 MG PO CPEP: 30.0000 mg | ORAL_CAPSULE | Freq: Every day | ORAL | 0 refills | Status: DC

## 2022-01-29 MED ORDER — DULOXETINE HCL 60 MG PO CPEP: 60.0000 mg | ORAL_CAPSULE | Freq: Every day | ORAL | 5 refills | Status: AC

## 2022-01-29 NOTE — Progress Notes (Signed)
Chief Complaint  Patient presents with   New Patient (Initial Visit)    Rm 17 alone here for consult on neck pain. Pt reports 3-4 weeks ago she developed a severe cold she woke up gasping for air/coughing/ During this event the top of her neck started to burn, felt hot and feels like she has damanged the nerves in her neck, upper back, and into the base of the neck. Reports sx have worsened and feels like a pinched nerve could be a possible.         ASSESSMENT AND PLAN  Faith [name] is a 66 y.o. female   Worsening neck pain  Radiating pain along her spine, brisk reflex on examination,  MRI of cervical spine without contrast to rule out cervical spondylitic myelopathy  Referral to physical therapy  Cymbalta 30 mg daily titrating to 60 mg daily  At risk for obstructive sleep apnea  Snoring, gasping for air episode, narrow oropharyngeal space  Refer to sleep study   DIAGNOSTIC DATA (LABS, IMAGING, TESTING) - I reviewed patient records, labs, notes, testing and imaging myself where available.   MEDICAL HISTORY:  Faith [name] is a 66 year old female, seen in request by her primary care physician Dr. Cristie Hem for evaluation of worsening neck pain, frequent awakening at nighttime  I reviewed and summarized the referring note. PMHX. HTN HLD Gastric Ulcer Osteopenia Gastric bypass in 2003,  she lost 70s Lb  Patient reported long history of neck pain, starting at age 58, she had sudden onset loud cracking sound work turning her neck suddenly 1 day, difficult Street her neck for few days,  Since then, she has intermittent neck pain, often wraparound to become a headache, bilateral retro-orbital area, over the years, she has been using lidocaine patch, epidural injection for her flareup of neck pain  Over the past few years, sometimes she has intermittent cough, sometimes woke her up from sleep gasping for air, with mild cough,  She had 1 of those episode in August 2023, 1  night she woke up with mild cough, during the cough, she felt shooting pain from upper neck to occipital region, hot flash, since then she had some problem, today she was able to walk with her friend for couple miles, but while ambulating, she had sudden onset dizziness, as if she is going to faint, has to resting for a while  Couple days ago she woke up with neck pain, dry heaves, shooting pain along her spine, also have constant left shoulder blade area discomfort  She denied persistent limb sensory loss, no weakness, no incontinence,  She was noted to have frequent snoring, sometimes daytime sleepiness, fatigue    PHYSICAL EXAM:   Vitals:   01/29/22 1452  BP: (!) 144/86  Pulse: (!) 55  Weight: 163 lb 6 oz (74.1 kg)  Height: '5\' 3"'$  (1.6 m)   Not recorded     Body mass index is 28.94 kg/m.  PHYSICAL EXAMNIATION:  Gen: NAD, conversant, well nourised, well groomed                     Cardiovascular: Regular rate rhythm, no peripheral edema, warm, nontender. Eyes: Conjunctivae clear without exudates or hemorrhage Neck: Supple, no carotid bruits. Pulmonary: Clear to auscultation bilaterally   NEUROLOGICAL EXAM:  MENTAL STATUS: Speech/cognition: Awake, alert, oriented to history taking and casual conversation CRANIAL NERVES: CN II: Visual fields are full to confrontation. Pupils are round equal and briskly reactive to light.  CN III, IV, VI: extraocular movement are normal. No ptosis. CN V: Facial sensation is intact to light touch CN VII: Face is symmetric with normal eye closure  CN VIII: Hearing is normal to causal conversation. CN IX, X: Phonation is normal. CN XI: Head turning and shoulder shrug are intact CN XII: Narrow oral pharyngeal space  MOTOR: There is no pronator drift of out-stretched arms. Muscle bulk and tone are normal. Muscle strength is normal.  REFLEXES: Reflexes are 2+ and symmetric at the biceps, triceps, 3/3 knees, and ankles. Plantar responses are  flexor.  SENSORY: Intact to light touch, pinprick and vibratory sensation are intact in fingers and toes.  COORDINATION: There is no trunk or limb dysmetria noted.  GAIT/STANCE: Able to get up from seated position arm crossed,  posture is normal. Gait is steady with normal steps, base, arm swing, and turning. Heel and toe walking are normal. Tandem gait is normal.  Romberg is absent.  REVIEW OF SYSTEMS:  Full 14 system review of systems performed and notable only for as above All other review of systems were negative.   ALLERGIES: No Known Allergies  HOME MEDICATIONS: Current Outpatient Medications  Medication Sig Dispense Refill   AMBULATORY NON FORMULARY MEDICATION Probioslim Extra Strength Probiotic, fat burner and bloat Tae 2 tablet by mouth daily     AUVI-Q 0.3 MG/0.3ML SOAJ injection Inject 0.3 mg into the muscle as needed.     azelastine (ASTELIN) 0.1 % nasal spray      B Complex Vitamins (VITAMIN B COMPLEX PO) Take 1 tablet by mouth daily.     CALCIUM PO Take 1 tablet by mouth daily. Adult chewable calcium supplement     Ferrous Sulfate (IRON PO) Take 1 tablet by mouth daily.     levocetirizine (XYZAL) 5 MG tablet Take 1 tablet by mouth daily.  3   lisinopril (ZESTRIL) 20 MG tablet TK 1 T PO D     losartan (COZAAR) 50 MG tablet TAKE 1 TABLET(50 MG) BY MOUTH DAILY 90 tablet 3   Multiple Vitamin (MULITIVITAMIN WITH MINERALS) TABS Take 3 tablets by mouth daily. Adult gummy multi-vitamins     omeprazole (PRILOSEC) 20 MG capsule Take 20 mg by mouth daily.     Probiotic Product (PROBIOTIC DAILY PO) Take 1 capsule daily by mouth.     rosuvastatin (CRESTOR) 20 MG tablet Take 1 tablet (20 mg total) by mouth daily. 90 tablet 3   VITAMIN D PO Take 1 tablet by mouth daily.     No current facility-administered medications for this visit.    PAST MEDICAL HISTORY: Past Medical History:  Diagnosis Date   Allergy    spring    Anemia    yrs ago    Arthritis    Family history  of colon cancer in father    Gastric ulcer    GERD (gastroesophageal reflux disease)    Hypertension    MVP (mitral valve prolapse)    Osteopenia     PAST SURGICAL HISTORY: Past Surgical History:  Procedure Laterality Date   BREAST EXCISIONAL BIOPSY Right 1986   BREAST REDUCTION SURGERY     CHOLECYSTECTOMY     COLONOSCOPY     GASTRIC BYPASS     KNEE ARTHROSCOPY     right   REDUCTION MAMMAPLASTY Bilateral 2001   SKIN CANCER EXCISION     TEMPOROMANDIBULAR JOINT SURGERY     TONSILLECTOMY AND ADENOIDECTOMY     TUBAL LIGATION     UPPER GASTROINTESTINAL ENDOSCOPY  FAMILY HISTORY: Family History  Adopted: Yes  Problem Relation Age of Onset   Colon cancer Father 90    SOCIAL HISTORY: Social History   Socioeconomic History   Marital status: Married    Spouse name: Not on file   Number of children: 2   Years of education: Not on file   Highest education level: Not on file  Occupational History   Occupation: Mental Health Therapist  Tobacco Use   Smoking status: Never   Smokeless tobacco: Never  Vaping Use   Vaping Use: Never used  Substance and Sexual Activity   Alcohol use: Yes    Alcohol/week: 0.0 standard drinks of alcohol    Comment: rare   Drug use: No   Sexual activity: Not on file  Other Topics Concern   Not on file  Social History Narrative   Not on file   Social Determinants of Health   Financial Resource Strain: Not on file  Food Insecurity: Not on file  Transportation Needs: Not on file  Physical Activity: Not on file  Stress: Not on file  Social Connections: Not on file  Intimate Partner Violence: Not on file      Marcial Pacas, M.D. Ph.D.  [facility] [address] [place], [place] [address] Ph: [phone] Fax: [phone]  CC:  Michael Boston, MD [place],  [place] [address]  Jacalyn Lefevre Jesse Sans, MD

## 2022-02-02 DIAGNOSIS — J3089 Other allergic rhinitis: Secondary | ICD-10-CM | POA: Diagnosis not present

## 2022-02-02 DIAGNOSIS — J301 Allergic rhinitis due to pollen: Secondary | ICD-10-CM | POA: Diagnosis not present

## 2022-02-03 ENCOUNTER — Telehealth: Payer: Self-pay | Admitting: Neurology

## 2022-02-03 NOTE — Telephone Encounter (Signed)
Pt called wanting to discuss her medications with the RN. Please advise.

## 2022-02-03 NOTE — Telephone Encounter (Signed)
I called pt. No answer, left a message asking pt to call me back.   

## 2022-02-03 NOTE — Telephone Encounter (Signed)
Pt said Dr. Krista Blue said she would prescribe a prescription for a over the counter medication like Advil  or Tylenol. Do not remember what medication.  Would like a call from the nurse.

## 2022-02-04 NOTE — Telephone Encounter (Signed)
Pt called to verify medications MD sent to pharmacy.  Pt states she is seeing some improvement with Cymbalta. Awaiting MRI

## 2022-02-05 ENCOUNTER — Telehealth: Payer: Self-pay | Admitting: Neurology

## 2022-02-05 NOTE — Telephone Encounter (Signed)
30 mins MR cervical spine wo contrast Dr. Willette Pa Josem Kaufmann: [id number] exp. 02/05/22-03/06/22 scheduled at [facility] 02/17/22 at 9:30am

## 2022-02-12 NOTE — Therapy (Signed)
OUTPATIENT PHYSICAL THERAPY EVALUATION   Patient Name: Faith [name] MRN: [id number] DOB:12-18-1955, 66 y.o., female Today's Date: 02/13/2022   PT End of Session - 02/13/22 1058     Visit Number 1    Number of Visits 9    Date for PT Re-Evaluation 04/10/22    Authorization Type BCBS    PT Start Time 1045    PT Stop Time 1130    PT Time Calculation (min) 45 min    Activity Tolerance Patient tolerated treatment well    Behavior During Therapy WFL for tasks assessed/performed             Past Medical History:  Diagnosis Date   Allergy    spring    Anemia    yrs ago    Arthritis    Family history of colon cancer in father    Gastric ulcer    GERD (gastroesophageal reflux disease)    Hypertension    MVP (mitral valve prolapse)    Osteopenia    Past Surgical History:  Procedure Laterality Date   BREAST EXCISIONAL BIOPSY Right 1986   BREAST REDUCTION SURGERY     CHOLECYSTECTOMY     COLONOSCOPY     GASTRIC BYPASS     KNEE ARTHROSCOPY     right   REDUCTION MAMMAPLASTY Bilateral 2001   SKIN CANCER EXCISION     TEMPOROMANDIBULAR JOINT SURGERY     TONSILLECTOMY AND ADENOIDECTOMY     TUBAL LIGATION     UPPER GASTROINTESTINAL ENDOSCOPY     Patient Active Problem List   Diagnosis Date Noted   Neck pain 01/29/2022   Snores 01/29/2022   Coronary artery calcification 10/10/2019   Hyperlipidemia 64/33/2951   Periumbilical pain 88/41/6606   Generalized abdominal pain 09/05/2019   Elevated lipase 09/05/2019   Chest pain of uncertain etiology 30/16/0109   Osteoarthritis of right knee 01/14/2016   Memory loss 08/22/2014   Hyperreflexia 08/22/2014   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   Lap Chole 2009 in [place] 06/17/2012    PCP: Michael Boston, MD  REFERRING PROVIDER: Marcial Pacas, MD  REFERRING DIAG: Neck pain  THERAPY DIAG:  Cervicalgia  Abnormal posture  Muscle weakness (generalized)  Rationale for Evaluation and Treatment  Rehabilitation  ONSET DATE: 2 months ago   SUBJECTIVE:                                                                                                                                                                                                        SUBJECTIVE STATEMENT:  Patient reports that she woke up one night coughing and gasping for air, then all of a sudden she felt an electric in her neck and shoulders. She states it was hot and itchy. Since that time she has had the feeling of nerve damage in her shoulders. At the time it went lower in her back but that has stopped. She is still having tingling and tight feeling in her shoulders. She also notes chronic stiffness in her neck and for most of her life she has had pain at the base of her skull that has given her headaches. She states when she was a sophomore in college she woke up and her neck cracked which resulted in intermittent pain in her neck. Patient reports that activity or movement does not spark her symptoms, it just comes on, and states there is nothing she can do to make it better.   PERTINENT HISTORY:  Chronic neck pain  PAIN:  Are you having pain? Yes:  NPRS scale: 0/10 (7/10 pain at worse) Pain location: Neck and bilateral shoulders (L > R) Pain description: Tightness, tingling Aggravating factors: Unknown Relieving factors: Medication  PRECAUTIONS: None  WEIGHT BEARING RESTRICTIONS No  FALLS:  Has patient fallen in last 6 months? No  LIVING ENVIRONMENT: Lives with: lives with their spouse  OCCUPATION: Part-time, counseling therapist  PLOF: Independent  PATIENT GOALS: Alleviate pain   OBJECTIVE:  DIAGNOSTIC FINDINGS:  Cervical MRI scheduled  PATIENT SURVEYS:  FOTO 53% functional status  COGNITION: Overall cognitive status: Within functional limits for tasks assessed  SENSATION: WFL  POSTURE:  Rounded shoulder and forward head posture  PALPATION: Tender to palpation bilateral upper trap,  cervical paraspinals, suboccipitals   CERVICAL ROM:   Active ROM A/PROM (deg) eval  Flexion 70  Extension 30  Right lateral flexion 30  Left lateral flexion 25  Right rotation 55  Left rotation 40   UPPER EXTREMITY ROM:   Patient demonstrates UE ROM grossly WFL  UPPER EXTREMITY MMT:  MMT Right eval Left eval  Shoulder flexion 5 5  Shoulder extension 5 5  Shoulder abduction 5 5  Shoulder internal rotation 5 5  Shoulder external rotation 5 5  Middle trapezius 4- 4-  Lower trapezius 4- 4-   CERVICAL SPECIAL TESTS:  Radicular testing grossly negative, patient does report muscular tightness at end range  FUNCTIONAL TESTS:  DNF endurance: 12 seconds   TODAY'S TREATMENT:  Supine chin tuck with towel roll 10 x 5 sec Sidelying thoracic rotation 5 x 5 sec each Row with green x 10  PATIENT EDUCATION:  Education details: Exam findings, POC, HEP Person educated: Patient Education method: Explanation, Demonstration, Tactile cues, Verbal cues, and Handouts Education comprehension: verbalized understanding, returned demonstration, verbal cues required, tactile cues required, and needs further education  HOME EXERCISE PROGRAM: Access Code: [email or web address]   ASSESSMENT: CLINICAL IMPRESSION: Patient is a 66 y.o. female who was seen today for physical therapy evaluation and treatment for chronic neck pain. She demonstrates limitation in her cervical mobility and muscular tightness, palpable muscular tension throughout cervical region, poor postural control and endurance, and parascapular muscular weakness. She doesn't seem to have any radicular symptoms.   OBJECTIVE IMPAIRMENTS decreased activity tolerance, decreased endurance, decreased ROM, decreased strength, impaired flexibility, postural dysfunction, and pain.   ACTIVITY LIMITATIONS carrying, lifting, sitting, and hygiene/grooming  PARTICIPATION LIMITATIONS: meal prep, cleaning, driving, and occupation  PERSONAL FACTORS  Past/current experiences and Time since onset of injury/illness/exacerbation are also affecting patient's functional outcome.   REHAB  POTENTIAL: Good  CLINICAL DECISION MAKING: Stable/uncomplicated  EVALUATION COMPLEXITY: Low   GOALS: Goals reviewed with patient? Yes  SHORT TERM GOALS: Target date: 03/13/2022   Patient will be I with initial HEP in order to progress with therapy. Baseline: HEP provided at eval Goal status: INITIAL  2.  PT will review FOTO with patient by 3rd visit in order to understand expected progress and outcome with therapy. Baseline: FOTO assessed at eval Goal status: INITIAL  3.  Patient will report pain level </= 4/10 in order to reduce functional limitations Baseline: 7/10 Goal status: INITIAL  LONG TERM GOALS: Target date: 04/10/2022  Patient will be I with final HEP to maintain progress from PT. Baseline: HEP provided at eval Goal status: INITIAL  2.  Patient will report >/= 64% status on FOTO to indicate improved functional ability. Baseline: 53% functional status Goal status: INITIAL  3.  Patient will demonstrate left cervical rotation AROM >/= 60 deg in order to improve driving ability. Baseline: 40 deg Goal status: INITIAL  4.  Patient will demonstrate DNF endurance >/= 30 seconds in order to improve postural control and reduce neck pain. Baseline: 12 seconds Goal status: INITIAL   PLAN: PT FREQUENCY: 1x/week  PT DURATION: 8 weeks  PLANNED INTERVENTIONS: Therapeutic exercises, Therapeutic activity, Neuromuscular re-education, Balance training, Gait training, Patient/Family education, Self Care, Joint mobilization, Joint manipulation, Aquatic Therapy, Dry Needling, Electrical stimulation, Spinal manipulation, Spinal mobilization, Cryotherapy, Moist heat, Taping, Manual therapy, and Re-evaluation  PLAN FOR NEXT SESSION: Review HEP and progress PRN, manual/dry needling for upper trap and neck region, cervical and thoracic mobility,  progress postural control   Hilda Blades, PT, DPT, LAT, ATC 02/13/22  12:21 PM Phone: [phone] Fax: [phone]

## 2022-02-13 ENCOUNTER — Encounter: Payer: Self-pay | Admitting: Physical Therapy

## 2022-02-13 ENCOUNTER — Other Ambulatory Visit: Payer: Self-pay

## 2022-02-13 ENCOUNTER — Ambulatory Visit: Payer: BC Managed Care – PPO | Attending: Neurology | Admitting: Physical Therapy

## 2022-02-13 DIAGNOSIS — M542 Cervicalgia: Secondary | ICD-10-CM | POA: Diagnosis present

## 2022-02-13 DIAGNOSIS — M6281 Muscle weakness (generalized): Secondary | ICD-10-CM | POA: Insufficient documentation

## 2022-02-13 DIAGNOSIS — R293 Abnormal posture: Secondary | ICD-10-CM | POA: Insufficient documentation

## 2022-02-13 DIAGNOSIS — R0683 Snoring: Secondary | ICD-10-CM | POA: Insufficient documentation

## 2022-02-13 NOTE — Patient Instructions (Signed)
Access Code: [email or web address] URL: [email or web address] Date: 02/13/2022 Prepared by: Hilda Blades  Exercises - Supine Cervical Retraction with Towel  - 1-2 x daily - 2 sets - 10 reps - 5 seconds hold - Sidelying Thoracic Lumbar Rotation  - 1-2 x daily - 2 sets - 10 reps - 5 seconds hold - Standing Row with Anchored Resistance  - 1-2 x daily - 2 sets - 20 reps

## 2022-02-16 DIAGNOSIS — J301 Allergic rhinitis due to pollen: Secondary | ICD-10-CM | POA: Diagnosis not present

## 2022-02-16 DIAGNOSIS — J3089 Other allergic rhinitis: Secondary | ICD-10-CM | POA: Diagnosis not present

## 2022-02-16 DIAGNOSIS — J3081 Allergic rhinitis due to animal (cat) (dog) hair and dander: Secondary | ICD-10-CM | POA: Diagnosis not present

## 2022-02-16 NOTE — Therapy (Signed)
OUTPATIENT PHYSICAL THERAPY TREATMENT NOTE   Patient Name: Faith [name] MRN: [id number] DOB:Sep 21, 1955, 66 y.o., female Today's Date: 02/17/2022  PCP: Michael Boston, MD REFERRING PROVIDER: Marcial Pacas, MD  END OF SESSION:   PT End of Session - 02/17/22 1408     Visit Number 2    Number of Visits 9    Date for PT Re-Evaluation 04/10/22    Authorization Type BCBS    PT Start Time 1409   patient late   PT Stop Time 1454   10 minutes MHP   PT Time Calculation (min) 45 min    Activity Tolerance Patient tolerated treatment well    Behavior During Therapy WFL for tasks assessed/performed             Past Medical History:  Diagnosis Date   Allergy    spring    Anemia    yrs ago    Arthritis    Family history of colon cancer in father    Gastric ulcer    GERD (gastroesophageal reflux disease)    Hypertension    MVP (mitral valve prolapse)    Osteopenia    Past Surgical History:  Procedure Laterality Date   BREAST EXCISIONAL BIOPSY Right 1986   BREAST REDUCTION SURGERY     CHOLECYSTECTOMY     COLONOSCOPY     GASTRIC BYPASS     KNEE ARTHROSCOPY     right   REDUCTION MAMMAPLASTY Bilateral 2001   SKIN CANCER EXCISION     TEMPOROMANDIBULAR JOINT SURGERY     TONSILLECTOMY AND ADENOIDECTOMY     TUBAL LIGATION     UPPER GASTROINTESTINAL ENDOSCOPY     Patient Active Problem List   Diagnosis Date Noted   Neck pain 01/29/2022   Snores 01/29/2022   Coronary artery calcification 10/10/2019   Hyperlipidemia 05/26/3233   Periumbilical pain 57/32/2025   Generalized abdominal pain 09/05/2019   Elevated lipase 09/05/2019   Chest pain of uncertain etiology 42/70/6237   Osteoarthritis of right knee 01/14/2016   Memory loss 08/22/2014   Hyperreflexia 08/22/2014   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 She had an MRI this morning and the results are pending.   PAIN:  Are you having pain? Yes: NPRS scale: 8/10 Pain location: base of occiput radiating to frontal bone Pain description: headache Aggravating factors: weather Relieving factors: lidocaine patch   OBJECTIVE: (objective measures completed at initial evaluation unless otherwise dated)  DIAGNOSTIC FINDINGS:  Cervical MRI scheduled   PATIENT SURVEYS:  FOTO 53% functional status   COGNITION: Overall cognitive status: Within functional limits for tasks assessed   SENSATION: WFL   POSTURE:  Rounded shoulder and forward head posture   PALPATION: Tender to palpation bilateral upper trap, cervical paraspinals, suboccipitals              CERVICAL ROM:    Active ROM A/PROM (deg) eval  Flexion 70  Extension 30  Right lateral flexion 30  Left lateral flexion 25  Right rotation 55  Left rotation 40    UPPER EXTREMITY ROM:  Patient demonstrates UE ROM grossly WFL   UPPER EXTREMITY MMT:   MMT Right eval Left eval  Shoulder flexion 5 5  Shoulder extension 5 5  Shoulder abduction 5 5  Shoulder internal rotation 5 5  Shoulder external rotation 5 5  Middle trapezius 4- 4-  Lower trapezius 4- 4-    CERVICAL SPECIAL TESTS:  Radicular testing grossly negative, patient does report muscular tightness at end range   FUNCTIONAL TESTS:  DNF endurance: 12 seconds     TODAY'S TREATMENT:  [facility] Adult PT Treatment:                                                DATE: 02/17/22 Therapeutic Exercise: UBE level 1 x 2 min each fwd/bwd Supine cervical retraction x 10; 10 second hold  Bilateral shoulder ER 2 x 10; yellow band  Upper trap x 20 sec each  Manual Therapy: STM bilateral cervical  paraspinals, upper traps, levator scapulae Suboccipital release CPAs grade II-III C2-C7  Trigger Point Dry Needling Treatment: Pre-treatment instruction: Patient instructed on dry needling rationale, procedures, and possible side effects including pain during treatment (achy,cramping feeling), bruising, drop of blood, lightheadedness, nausea, sweating. Patient Consent Given: Yes Education handout provided: Yes Muscles treated: bilateral upper traps   Needle size and number: .30x67m x 1 Electrical stimulation performed: No Parameters: N/A Treatment response/outcome: Twitch response elicited and Palpable decrease in muscle tension Post-treatment instructions: Patient instructed to expect possible mild to moderate muscle soreness later today and/or tomorrow. Patient instructed in methods to reduce muscle soreness and to continue prescribed HEP. If patient was dry needled over the lung field, patient was instructed on signs and symptoms of pneumothorax and, however unlikely, to see immediate medical attention should they occur. Patient was also educated on signs and symptoms of infection and to seek medical attention should they occur. Patient verbalized understanding of these instructions and education.   Modalities: MHP cervical spine x 10 minutes    PATIENT EDUCATION:  Education details: TPDN Person educated: Patient Education method: Explanation,  Handouts Education comprehension: verbalized understanding   HOME EXERCISE PROGRAM: Access Code: [email or web address]    ASSESSMENT: CLINICAL IMPRESSION: Session somewhat limited as patient was late for scheduled appointment. TPDN was performed to bilateral upper traps with excellent twitch response elicited and a reduction in tautness noted. She reported soreness in bilateral traps post-intervention that is to be expected. She is noted to have tautness and palpable tenderness about suboccipitals and paraspinals and would potentially benefit from further  TPDN at future sessions. Overall good tolerance to postural strengthening with patient requiring minimal postural cues. MHP was utilized at conclusion of session to reduce upper trap soreness.    OBJECTIVE IMPAIRMENTS decreased activity tolerance, decreased endurance, decreased ROM, decreased strength, impaired flexibility, postural dysfunction, and pain.    ACTIVITY LIMITATIONS carrying, lifting, sitting, and hygiene/grooming   PARTICIPATION LIMITATIONS: meal prep, cleaning, driving, and occupation   PERSONAL FACTORS Past/current experiences and Time since onset of injury/illness/exacerbation are also affecting patient's functional outcome.    REHAB POTENTIAL: Good   CLINICAL DECISION MAKING: Stable/uncomplicated   EVALUATION COMPLEXITY: Low     GOALS: Goals reviewed with patient? Yes   SHORT TERM GOALS: Target date: 03/13/2022    Patient will be I with initial HEP in order to progress with therapy. Baseline: HEP provided at eval Goal status: INITIAL  2.  PT will review FOTO with patient by 3rd visit in order to understand expected progress and outcome with therapy. Baseline: FOTO assessed at eval Goal status: INITIAL   3.  Patient will report pain level </= 4/10 in order to reduce functional limitations Baseline: 7/10 Goal status: INITIAL   LONG TERM GOALS: Target date: 04/10/2022   Patient will be I with final HEP to maintain progress from PT. Baseline: HEP provided at eval Goal status: INITIAL   2.  Patient will report >/= 64% status on FOTO to indicate improved functional ability. Baseline: 53% functional status Goal status: INITIAL   3.  Patient will demonstrate left cervical rotation AROM >/= 60 deg in order to improve driving ability. Baseline: 40 deg Goal status: INITIAL   4.  Patient will demonstrate DNF endurance >/= 30 seconds in order to improve postural control and reduce neck pain. Baseline: 12 seconds Goal status: INITIAL     PLAN: PT FREQUENCY:  1x/week   PT DURATION: 8 weeks   PLANNED INTERVENTIONS: Therapeutic exercises, Therapeutic activity, Neuromuscular re-education, Balance training, Gait training, Patient/Family education, Self Care, Joint mobilization, Joint manipulation, Aquatic Therapy, Dry Needling, Electrical stimulation, Spinal manipulation, Spinal mobilization, Cryotherapy, Moist heat, Taping, Manual therapy, and Re-evaluation   PLAN FOR NEXT SESSION: Review HEP and progress PRN, manual/dry needling for upper trap and neck region, cervical and thoracic mobility, progress postural control   Gwendolyn Grant, PT, DPT, ATC 02/17/22 2:45 PM

## 2022-02-17 ENCOUNTER — Ambulatory Visit: Payer: BC Managed Care – PPO | Attending: Neurology

## 2022-02-17 ENCOUNTER — Ambulatory Visit (INDEPENDENT_AMBULATORY_CARE_PROVIDER_SITE_OTHER): Payer: BC Managed Care – PPO

## 2022-02-17 DIAGNOSIS — M542 Cervicalgia: Secondary | ICD-10-CM

## 2022-02-17 DIAGNOSIS — R0683 Snoring: Secondary | ICD-10-CM | POA: Diagnosis not present

## 2022-02-17 DIAGNOSIS — M6281 Muscle weakness (generalized): Secondary | ICD-10-CM | POA: Insufficient documentation

## 2022-02-17 DIAGNOSIS — R293 Abnormal posture: Secondary | ICD-10-CM | POA: Insufficient documentation

## 2022-02-17 NOTE — Patient Instructions (Signed)

## 2022-02-18 ENCOUNTER — Ambulatory Visit: Payer: BC Managed Care – PPO | Attending: Cardiovascular Disease

## 2022-02-18 ENCOUNTER — Telehealth: Payer: Self-pay | Admitting: Neurology

## 2022-02-18 DIAGNOSIS — E782 Mixed hyperlipidemia: Secondary | ICD-10-CM | POA: Diagnosis not present

## 2022-02-18 DIAGNOSIS — Z79899 Other long term (current) drug therapy: Secondary | ICD-10-CM | POA: Diagnosis not present

## 2022-02-18 DIAGNOSIS — I2510 Atherosclerotic heart disease of native coronary artery without angina pectoris: Secondary | ICD-10-CM | POA: Diagnosis not present

## 2022-02-18 DIAGNOSIS — I2584 Coronary atherosclerosis due to calcified coronary lesion: Secondary | ICD-10-CM | POA: Diagnosis not present

## 2022-02-18 LAB — BASIC METABOLIC PANEL
BUN/Creatinine Ratio: 18 (ref 12–28)
BUN: 11 mg/dL (ref 8–27)
CO2: 24 mmol/L (ref 20–29)
Calcium: 8.8 mg/dL (ref 8.7–10.3)
Chloride: 109 mmol/L — ABNORMAL HIGH (ref 96–106)
Creatinine, Ser: 0.61 mg/dL (ref 0.57–1.00)
Glucose: 84 mg/dL (ref 70–99)
Potassium: 4 mmol/L (ref 3.5–5.2)
Sodium: 144 mmol/L (ref 134–144)
eGFR: 100 mL/min/{1.73_m2} (ref >59)

## 2022-02-18 LAB — LIPID PANEL
Chol/HDL Ratio: 1.5 ratio (ref 0.0–4.4)
Cholesterol, Total: 136 mg/dL (ref 100–199)
HDL: 89 mg/dL (ref >39)
LDL Chol Calc (NIH): 36 mg/dL (ref 0–99)
Triglycerides: 48 mg/dL (ref 0–149)
VLDL Cholesterol Cal: 11 mg/dL (ref 5–40)

## 2022-02-18 LAB — ALT: ALT: 16 IU/L (ref 0–32)

## 2022-02-18 NOTE — Telephone Encounter (Signed)
Pt is asking to be called with results to MRI.  Pt asking that no results be put in my chart, just call.

## 2022-02-19 NOTE — Telephone Encounter (Signed)
Noted, I check on MRI results this morning and report has not been finalized. Will update pt once available.

## 2022-02-23 NOTE — Telephone Encounter (Signed)
Ms. Faith [name]    MRI of the cervical spine showed mild degenerative changes, there is no evidence of spinal cord or nerve root compression, variable degree of foraminal narrowing.  Please continue physical therapy, warm compression, as needed NSAIDs.   Marcial Pacas, M.D. Ph.D.   [facility] [place], [place] [address] Phone: [phone] Fax:      [phone]

## 2022-02-23 NOTE — Telephone Encounter (Signed)
I called the pt and relayed results of MRI per her request. She verbalized understanding and appreciation for the call.

## 2022-02-24 DIAGNOSIS — K2100 Gastro-esophageal reflux disease with esophagitis, without bleeding: Secondary | ICD-10-CM | POA: Diagnosis not present

## 2022-02-24 DIAGNOSIS — J301 Allergic rhinitis due to pollen: Secondary | ICD-10-CM | POA: Diagnosis not present

## 2022-02-24 DIAGNOSIS — R052 Subacute cough: Secondary | ICD-10-CM | POA: Diagnosis not present

## 2022-02-24 DIAGNOSIS — J3089 Other allergic rhinitis: Secondary | ICD-10-CM | POA: Diagnosis not present

## 2022-02-25 ENCOUNTER — Ambulatory Visit: Payer: BC Managed Care – PPO

## 2022-02-25 DIAGNOSIS — R293 Abnormal posture: Secondary | ICD-10-CM | POA: Diagnosis not present

## 2022-02-25 DIAGNOSIS — M542 Cervicalgia: Secondary | ICD-10-CM

## 2022-02-25 DIAGNOSIS — M6281 Muscle weakness (generalized): Secondary | ICD-10-CM

## 2022-02-25 NOTE — Therapy (Signed)
OUTPATIENT PHYSICAL THERAPY TREATMENT NOTE   Patient Name: Faith [name] MRN: [id number] DOB:1956/02/09, 66 y.o., female Today's Date: 02/25/2022  PCP: Michael Boston, MD REFERRING PROVIDER: Marcial Pacas, MD  END OF SESSION:   PT End of Session - 02/25/22 1447     Visit Number 3    Number of Visits 9    Date for PT Re-Evaluation 04/10/22    Authorization Type BCBS    PT Start Time 1447    PT Stop Time 5625    PT Time Calculation (min) 43 min    Activity Tolerance Patient tolerated treatment well    Behavior During Therapy WFL for tasks assessed/performed              Past Medical History:  Diagnosis Date   Allergy    spring    Anemia    yrs ago    Arthritis    Family history of colon cancer in father    Gastric ulcer    GERD (gastroesophageal reflux disease)    Hypertension    MVP (mitral valve prolapse)    Osteopenia    Past Surgical History:  Procedure Laterality Date   BREAST EXCISIONAL BIOPSY Right 1986   BREAST REDUCTION SURGERY     CHOLECYSTECTOMY     COLONOSCOPY     GASTRIC BYPASS     KNEE ARTHROSCOPY     right   REDUCTION MAMMAPLASTY Bilateral 2001   SKIN CANCER EXCISION     TEMPOROMANDIBULAR JOINT SURGERY     TONSILLECTOMY AND ADENOIDECTOMY     TUBAL LIGATION     UPPER GASTROINTESTINAL ENDOSCOPY     Patient Active Problem List   Diagnosis Date Noted   Neck pain 01/29/2022   Snores 01/29/2022   Coronary artery calcification 10/10/2019   Hyperlipidemia 63/89/3734   Periumbilical pain 28/76/8115   Generalized abdominal pain 09/05/2019   Elevated lipase 09/05/2019   Chest pain of uncertain etiology 72/62/0355   Osteoarthritis of right knee 01/14/2016   Memory loss 08/22/2014   Hyperreflexia 08/22/2014   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 Still a dull ache, but a little more tolerable." She has soreness in the Rt upper trap for a few days after last session, but does feel like the TPDN helped.    PAIN:  Are you having pain? Yes: NPRS scale: 6/10 Pain location: neck Pain description: dull ache Aggravating factors: weather Relieving factors: lidocaine patch   OBJECTIVE: (objective measures completed at initial evaluation unless otherwise dated)  DIAGNOSTIC FINDINGS:  Cervical MRI scheduled   PATIENT SURVEYS:  FOTO 53% functional status   COGNITION: Overall cognitive status: Within functional limits for tasks assessed   SENSATION: WFL   POSTURE:  Rounded shoulder and forward head posture   PALPATION: Tender to palpation bilateral upper trap, cervical paraspinals, suboccipitals              CERVICAL ROM:    Active ROM A/PROM (deg) eval 02/25/22  Flexion 70   Extension 30   Right lateral flexion 30   Left lateral flexion 25   Right rotation 55 55  Left rotation 40 60    UPPER EXTREMITY ROM:  Patient demonstrates UE ROM grossly WFL   UPPER EXTREMITY MMT:   MMT Right eval Left eval  Shoulder flexion 5 5  Shoulder extension 5 5  Shoulder abduction 5 5  Shoulder internal rotation 5 5  Shoulder external rotation 5 5  Middle trapezius 4- 4-  Lower trapezius 4- 4-    CERVICAL SPECIAL TESTS:  Radicular testing grossly negative, patient does report muscular tightness at end range   FUNCTIONAL TESTS:  DNF endurance: 12 seconds     TODAY'S TREATMENT:  [facility] Adult PT Treatment:                                                DATE: 02/25/22 Therapeutic Exercise: UBE level 2 x 2 min each fwd/bwd  Cervical extension SNAG x 10  Cervical rotation SNAG  x 10 each  Seated cervical retraction x10  Prone scapular retraction 2 x 10  Seated  thoracic extension x 10  Seated horizontal shoulder abduction red band 2 x 10  Updated HEP  Manual Therapy: STM bilateral cervical paraspinals, upper traps, levator scapulae Suboccipital release Trigger Point Dry Needling Treatment: Pre-treatment instruction: Patient instructed on dry needling rationale, procedures, and possible side effects including pain during treatment (achy,cramping feeling), bruising, drop of blood, lightheadedness, nausea, sweating. Patient Consent Given: Yes Education handout provided: Previously provided Muscles treated: cervical paraspinals and suboccipitals   Needle size and number: .25x71m x 2 Treatment response/outcome: Twitch response elicited and Palpable decrease in muscle tension Post-treatment instructions: Patient instructed to expect possible mild to moderate muscle soreness later today and/or tomorrow. Patient instructed in methods to reduce muscle soreness and to continue prescribed HEP. If patient was dry needled over the lung field, patient was instructed on signs and symptoms of pneumothorax and, however unlikely, to see immediate medical attention should they occur. Patient was also educated on signs and symptoms of infection and to seek medical attention should they occur. Patient verbalized understanding of these instructions and education.    [facility]:                                                DATE: 02/17/22 Therapeutic Exercise: UBE level 1 x 2 min each fwd/bwd Supine cervical retraction x 10; 10 second hold  Bilateral shoulder ER 2 x 10; yellow band  Upper trap x 20 sec each  Manual Therapy: STM bilateral cervical paraspinals, upper traps, levator scapulae Suboccipital release CPAs grade II-III C2-C7  Trigger Point Dry Needling Treatment: Pre-treatment instruction: Patient instructed on dry needling rationale, procedures, and possible side effects including pain during treatment (achy,cramping feeling), bruising, drop of  blood, lightheadedness, nausea, sweating. Patient Consent Given: Yes Education handout provided: Yes Muscles treated: bilateral upper traps   Needle size and number: .30x531mx 1 Electrical stimulation performed: No Parameters: N/A Treatment response/outcome: Twitch response elicited and Palpable decrease in muscle tension Post-treatment instructions: Patient instructed to expect possible mild to moderate muscle soreness later today and/or tomorrow. Patient instructed in methods to reduce muscle soreness and to continue prescribed HEP. If patient was dry needled over the lung field, patient was instructed on signs and symptoms of pneumothorax and, however unlikely, to see immediate medical attention should they occur. Patient  was also educated on signs and symptoms of infection and to seek medical attention should they occur. Patient verbalized understanding of these instructions and education.   Modalities: MHP cervical spine x 10 minutes    PATIENT EDUCATION:  Education details: TPDN; HEP Person educated: Patient Education method: Explanation,  Handouts, demo, cues Education comprehension: verbalized understanding, returned demo, cues    HOME EXERCISE PROGRAM: Access Code: [email or web address]     ASSESSMENT: CLINICAL IMPRESSION: Patient tolerated session well today focusing on cervical mobility and postural strengthening. Her Lt cervical rotation AROM has significantly improved compared to baseline, but Rt rotation remains unchanged. She has difficulty controlling for excessive upper trap engagement with prone postural strengthening. HEP was updated to include further spinal mobility and postural strengthening exercises.    OBJECTIVE IMPAIRMENTS decreased activity tolerance, decreased endurance, decreased ROM, decreased strength, impaired flexibility, postural dysfunction, and pain.    ACTIVITY LIMITATIONS carrying, lifting, sitting, and hygiene/grooming   PARTICIPATION LIMITATIONS: meal prep,  cleaning, driving, and occupation   PERSONAL FACTORS Past/current experiences and Time since onset of injury/illness/exacerbation are also affecting patient's functional outcome.    REHAB POTENTIAL: Good   CLINICAL DECISION MAKING: Stable/uncomplicated   EVALUATION COMPLEXITY: Low     GOALS: Goals reviewed with patient? Yes   SHORT TERM GOALS: Target date: 03/13/2022    Patient will be I with initial HEP in order to progress with therapy. Baseline: HEP provided at eval Goal status: INITIAL   2.  PT will review FOTO with patient by 3rd visit in order to understand expected progress and outcome with therapy. Baseline: FOTO assessed at eval Goal status: INITIAL   3.  Patient will report pain level </= 4/10 in order to reduce functional limitations Baseline: 7/10 Goal status: INITIAL   LONG TERM GOALS: Target date: 04/10/2022   Patient will be I with final HEP to maintain progress from PT. Baseline: HEP provided at eval Goal status: INITIAL   2.  Patient will report >/= 64% status on FOTO to indicate improved functional ability. Baseline: 53% functional status Goal status: INITIAL   3.  Patient will demonstrate left cervical rotation AROM >/= 60 deg in order to improve driving ability. Baseline: 40 deg Goal status: INITIAL   4.  Patient will demonstrate DNF endurance >/= 30 seconds in order to improve postural control and reduce neck pain. Baseline: 12 seconds Goal status: INITIAL     PLAN: PT FREQUENCY: 1x/week   PT DURATION: 8 weeks   PLANNED INTERVENTIONS: Therapeutic exercises, Therapeutic activity, Neuromuscular re-education, Balance training, Gait training, Patient/Family education, Self Care, Joint mobilization, Joint manipulation, Aquatic Therapy, Dry Needling, Electrical stimulation, Spinal manipulation, Spinal mobilization, Cryotherapy, Moist heat, Taping, Manual therapy, and Re-evaluation   PLAN FOR NEXT SESSION: Review HEP and progress PRN, manual/dry  needling for upper trap and neck region, cervical and thoracic mobility, progress postural control   Gwendolyn Grant, PT, DPT, ATC 02/25/22 3:30 PM

## 2022-03-02 DIAGNOSIS — J301 Allergic rhinitis due to pollen: Secondary | ICD-10-CM | POA: Diagnosis not present

## 2022-03-02 DIAGNOSIS — J3089 Other allergic rhinitis: Secondary | ICD-10-CM | POA: Diagnosis not present

## 2022-03-02 DIAGNOSIS — J3081 Allergic rhinitis due to animal (cat) (dog) hair and dander: Secondary | ICD-10-CM | POA: Diagnosis not present

## 2022-03-02 NOTE — Therapy (Signed)
OUTPATIENT PHYSICAL THERAPY TREATMENT NOTE   Patient Name: Faith [name] MRN: [id number] DOB:02/22/1956, 66 y.o., female Today's Date: 03/03/2022  PCP: Michael Boston, MD REFERRING PROVIDER: Marcial Pacas, MD  END OF SESSION:   PT End of Session - 03/03/22 1619     Visit Number 4    Number of Visits 9    Date for PT Re-Evaluation 04/10/22    Authorization Type BCBS    PT Start Time 1533    PT Stop Time 1618    PT Time Calculation (min) 45 min    Activity Tolerance Patient tolerated treatment well    Behavior During Therapy WFL for tasks assessed/performed               Past Medical History:  Diagnosis Date   Allergy    spring    Anemia    yrs ago    Arthritis    Family history of colon cancer in father    Gastric ulcer    GERD (gastroesophageal reflux disease)    Hypertension    MVP (mitral valve prolapse)    Osteopenia    Past Surgical History:  Procedure Laterality Date   BREAST EXCISIONAL BIOPSY Right 1986   BREAST REDUCTION SURGERY     CHOLECYSTECTOMY     COLONOSCOPY     GASTRIC BYPASS     KNEE ARTHROSCOPY     right   REDUCTION MAMMAPLASTY Bilateral 2001   SKIN CANCER EXCISION     TEMPOROMANDIBULAR JOINT SURGERY     TONSILLECTOMY AND ADENOIDECTOMY     TUBAL LIGATION     UPPER GASTROINTESTINAL ENDOSCOPY     Patient Active Problem List   Diagnosis Date Noted   Neck pain 01/29/2022   Snores 01/29/2022   Coronary artery calcification 10/10/2019   Hyperlipidemia 65/99/3570   Periumbilical pain 17/79/3903   Generalized abdominal pain 09/05/2019   Elevated lipase 09/05/2019   Chest pain of uncertain etiology 00/92/3300   Osteoarthritis of right knee 01/14/2016   Memory loss 08/22/2014   Hyperreflexia 08/22/2014   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 She does report he neck pain is getting better and the exercises are helping.   PAIN:  Are you having pain? Yes:  NPRS scale: 5/10 (7/10 pain at worse) Pain location: Neck (base of scull) and bilateral shoulders (L > R) Pain description: Tightness, soreness, "nails" Aggravating factors: Unknown Relieving factors: Medication   OBJECTIVE: (objective measures completed at initial evaluation unless otherwise dated) PATIENT SURVEYS:  FOTO 53% functional status   POSTURE:  Rounded shoulder and forward head posture   PALPATION: Tender to palpation bilateral upper trap, cervical paraspinals, suboccipitals              CERVICAL ROM:    Active ROM A/PROM (deg) eval 02/25/22  Flexion 70   Extension 30   Right lateral flexion 30   Left lateral flexion 25   Right rotation 55 55  Left rotation 40 60    UPPER EXTREMITY MMT:   MMT Right eval  Left eval  Shoulder flexion 5 5  Shoulder extension 5 5  Shoulder abduction 5 5  Shoulder internal rotation 5 5  Shoulder external rotation 5 5  Middle trapezius 4- 4-  Lower trapezius 4- 4-    CERVICAL SPECIAL TESTS:  Radicular testing grossly negative, patient does report muscular tightness at end range   FUNCTIONAL TESTS:  DNF endurance: 12 seconds     TODAY'S TREATMENT:  [facility] Adult PT Treatment:                                                DATE: 03/03/22 Therapeutic Exercise: UBE L3 x 4 min (2 fwd/bwd) while taking subjective Seated upper trap stretch 2 x 30 sec each Supine cervical retractions 10 x 5 sec Supine horizontal abduction with red while maintaining gentle chin tuck x 15 Supine scapular punch x 15 Sidelying thoracic rotation x 10 each Seated  thoracic extension x 10  Manual Therapy: Skilled palpation and monitoring of muscle tension while performing TPDN STM bilateral cervical paraspinals, upper traps, levator scapulae Suboccipital release with gentle manual traction Trigger Point Dry Needling Treatment: Pre-treatment instruction: Patient instructed on dry needling rationale, procedures, and possible side effects including pain during treatment (achy,cramping feeling), bruising, drop of blood, lightheadedness, nausea, sweating. Patient Consent Given: Yes Education handout provided: Previously provided Muscles treated: Bilateral upper trap Needle size and number: .30x68m x 5 Treatment response/outcome: Twitch response elicited and Palpable decrease in muscle tension Post-treatment instructions: Patient instructed to expect possible mild to moderate muscle soreness later today and/or tomorrow. Patient instructed in methods to reduce muscle soreness and to continue prescribed HEP. If patient was dry needled over the lung field, patient was instructed on signs and symptoms of pneumothorax and, however unlikely, to see immediate medical attention should they occur. Patient was also educated on signs and symptoms of infection and to seek medical attention should they occur. Patient verbalized understanding of these instructions and education.   [facility]                                                [facility]: [facility] Therapeutic Exercise: UBE level 2 x 2 min each fwd/bwd  Cervical extension SNAG x 10  Cervical rotation SNAG  x 10 each  Seated cervical retraction x10  Prone scapular retraction 2 x 10  Seated thoracic extension x 10  Seated horizontal shoulder abduction red band 2 x 10  Updated HEP  Manual Therapy: STM bilateral cervical paraspinals, upper traps, levator scapulae Suboccipital release Trigger Point Dry Needling Treatment: Pre-treatment instruction: Patient instructed on dry needling rationale,  procedures, and possible side effects including pain during treatment (achy,cramping feeling), bruising, drop of blood, lightheadedness, nausea, sweating. Patient Consent Given: Yes Education handout provided: Previously provided Muscles treated: cervical paraspinals and suboccipitals   Needle size and number: .25x377mx 2 Treatment response/outcome: Twitch response elicited and Palpable decrease in muscle tension Post-treatment instructions: Patient instructed to expect possible mild to moderate muscle soreness later today and/or tomorrow. Patient instructed in methods to reduce muscle soreness and to continue prescribed HEP. If patient was dry needled over the lung field, patient was instructed on signs and symptoms of pneumothorax and, however unlikely, to see  immediate medical attention should they occur. Patient was also educated on signs and symptoms of infection and to seek medical attention should they occur. Patient verbalized understanding of these instructions and education.  [facility] Adult PT Treatment:                                                DATE: 02/17/22 Therapeutic Exercise: UBE level 1 x 2 min each fwd/bwd Supine cervical retraction x 10; 10 second hold  Bilateral shoulder ER 2 x 10; yellow band  Upper trap x 20 sec each  Manual Therapy: STM bilateral cervical paraspinals, upper traps, levator scapulae Suboccipital release CPAs grade II-III C2-C7  Trigger Point Dry Needling Treatment: Pre-treatment instruction: Patient instructed on dry needling rationale, procedures, and possible side effects including pain during treatment (achy,cramping feeling), bruising, drop of blood, lightheadedness, nausea, sweating. Patient Consent Given: Yes Education handout provided: Yes Muscles treated: bilateral upper traps   Needle size and number: .30x59m x 1 Electrical stimulation performed: No Parameters: N/A Treatment response/outcome: Twitch response elicited and Palpable decrease in  muscle tension Post-treatment instructions: Patient instructed to expect possible mild to moderate muscle soreness later today and/or tomorrow. Patient instructed in methods to reduce muscle soreness and to continue prescribed HEP. If patient was dry needled over the lung field, patient was instructed on signs and symptoms of pneumothorax and, however unlikely, to see immediate medical attention should they occur. Patient was also educated on signs and symptoms of infection and to seek medical attention should they occur. Patient verbalized understanding of these instructions and education. Modalities: MHP cervical spine x 10 minutes    PATIENT EDUCATION:  Education details: HEP, TPDN Person educated: Patient Education method: Explanation, Demonstration, Tactile cues, Verbal cues Education comprehension: verbalized understanding, returned demonstration, verbal cues required, tactile cues required, and needs further education   HOME EXERCISE PROGRAM: Access Code: [email or web address]    ASSESSMENT: CLINICAL IMPRESSION: Patient tolerated therapy well with no adverse effects. Therapy continued to incorporate TPDN for upper trap region with more focus on left side. Twitch responses elicited and followed up with manual and stretching. Continued with spinal mobility and postural control exercises with good tolerance. Patient reports overall continued improvement in symptoms. No changes made to HEP this visit. Patient would benefit from continued skilled PT to progress her mobility and strength in order to reduce pain and maximize functional ability.    OBJECTIVE IMPAIRMENTS decreased activity tolerance, decreased endurance, decreased ROM, decreased strength, impaired flexibility, postural dysfunction, and pain.    ACTIVITY LIMITATIONS carrying, lifting, sitting, and hygiene/grooming   PARTICIPATION LIMITATIONS: meal prep, cleaning, driving, and occupation   PERSONAL FACTORS Past/current experiences and Time  since onset of injury/illness/exacerbation are also affecting patient's functional outcome.      GOALS: Goals reviewed with patient? Yes   SHORT TERM GOALS: Target date: 03/13/2022    Patient will be I with initial HEP in order to progress with therapy. Baseline: HEP provided at eval 03/03/2022: independent Goal status: MET   2.  PT will review FOTO with patient by 3rd visit in order to understand expected progress and outcome with therapy. Baseline: FOTO assessed at eval 03/03/2022: reviewed Goal status: MET   3.  Patient will report pain level </= 4/10 in order to reduce functional limitations Baseline: 7/10 03/03/2022: 5/10 pain level Goal status: PARTIALLY MET   LONG TERM  GOALS: Target date: 04/10/2022   Patient will be I with final HEP to maintain progress from PT. Baseline: HEP provided at eval Goal status: INITIAL   2.  Patient will report >/= 64% status on FOTO to indicate improved functional ability. Baseline: 53% functional status Goal status: INITIAL   3.  Patient will demonstrate left cervical rotation AROM >/= 60 deg in order to improve driving ability. Baseline: 40 deg Goal status: INITIAL   4.  Patient will demonstrate DNF endurance >/= 30 seconds in order to improve postural control and reduce neck pain. Baseline: 12 seconds Goal status: INITIAL     PLAN: PT FREQUENCY: 1x/week   PT DURATION: 8 weeks   PLANNED INTERVENTIONS: Therapeutic exercises, Therapeutic activity, Neuromuscular re-education, Balance training, Gait training, Patient/Family education, Self Care, Joint mobilization, Joint manipulation, Aquatic Therapy, Dry Needling, Electrical stimulation, Spinal manipulation, Spinal mobilization, Cryotherapy, Moist heat, Taping, Manual therapy, and Re-evaluation   PLAN FOR NEXT SESSION: Review HEP and progress PRN, manual/dry needling for upper trap and neck region, cervical and thoracic mobility, progress postural control   Hilda Blades, PT,  DPT, LAT, ATC 03/03/22  4:37 PM Phone: [phone] Fax: [phone]

## 2022-03-03 ENCOUNTER — Ambulatory Visit: Payer: BC Managed Care – PPO | Admitting: Physical Therapy

## 2022-03-03 ENCOUNTER — Other Ambulatory Visit: Payer: Self-pay

## 2022-03-03 DIAGNOSIS — R293 Abnormal posture: Secondary | ICD-10-CM | POA: Diagnosis not present

## 2022-03-03 DIAGNOSIS — M542 Cervicalgia: Secondary | ICD-10-CM | POA: Diagnosis not present

## 2022-03-03 DIAGNOSIS — M6281 Muscle weakness (generalized): Secondary | ICD-10-CM | POA: Diagnosis not present

## 2022-03-04 ENCOUNTER — Encounter: Payer: Self-pay | Admitting: Neurology

## 2022-03-04 ENCOUNTER — Ambulatory Visit (INDEPENDENT_AMBULATORY_CARE_PROVIDER_SITE_OTHER): Payer: BC Managed Care – PPO | Admitting: Neurology

## 2022-03-04 VITALS — BP 130/78 | HR 55 | Ht 63.0 in | Wt 160.5 lb

## 2022-03-04 DIAGNOSIS — R292 Abnormal reflex: Secondary | ICD-10-CM

## 2022-03-04 DIAGNOSIS — R0683 Snoring: Secondary | ICD-10-CM | POA: Diagnosis not present

## 2022-03-04 MED ORDER — TRAZODONE HCL 50 MG PO TABS: 50.0000 mg | ORAL_TABLET | Freq: Every evening | ORAL | 0 refills | Status: AC | PRN

## 2022-03-04 NOTE — Progress Notes (Signed)
SLEEP MEDICINE CLINIC    Provider:  Larey Seat, MD  Primary Care Physician:  Michael Boston, [place] [address]     Referring Provider: Marcial Pacas, [place],  [place] [address]          Chief Complaint according to patient   Patient presents with:     New Patient (Initial Visit)           HISTORY OF PRESENT ILLNESS:  Faith [name] is a 66 y.o. year old White or Caucasian female patient seen here as a referral on 03/04/2022 from Dr Krista Blue  for a sleep apnea evaluation. .  Chief concern according to patient :  " I suffer from struggling for air when I have a cold, viral- gasping, and 2 months ago I had a spell of sudden heat and itching , somewhat like an electrical shock in my neck and shoulder : this has lasted- a tingling sensation has lasted. " " Ever since I was in college I had neck pain".      Faith [name] has a past medical history of Allergy, Anemia, Arthritis, Family history of colon cancer in father, Gastric ulcer, GERD (gastroesophageal reflux disease), Hypertension, MVP (mitral valve prolapse), and Osteopenia.   Sleep relevant medical history: Nocturia once and insomnia after - light sleeper.,Tonsillectomy at age 43, adenoids, cervical spine injury.   Family medical /sleep history: No other family member on CPAP with OSA, insomnia, sleep walkers.    Social history:  Patient is working as a Transport planner, and lives in a household with spouse . Adult children, Grandsons. . Tobacco use none .  ETOH use ; seldomly  Caffeine intake in form of Coffee( /) Soda( /) Tea ( green tea 1 cup ) or energy drinks. Regular exercise in form of gym, body pump classes. .   Hobbies :walking.       Sleep habits are as follows: The patient's dinner time is between 6-7 PM. The patient goes to bed at 11 PM and  needs 30 minutes to fall asleep- then she continues to sleep for 4 hours, wakes for 3-4 AM bathroom breaks. The preferred sleep  position is laterally, with the support of several pillows.  Dreams are reportedly frequent. 7.45  AM is the usual rise time. The patient wakes up spontaneously.  She reports not feeling refreshed or restored in AM, with symptoms such as dry mouth, morning headaches, neck pain and residual fatigue.  Naps are taken infrequently, lasting from 15-40  minutes and are more refreshing .    Review of Systems: Out of a complete 14 system review, the patient complains of only the following symptoms, and all other reviewed systems are negative.:  Fatigue, but less sleepiness , yes -snoring, fragmented sleep, Insomnia , HA. Neck pain, sore throat.    How likely are you to doze in the following situations: 0 = not likely, 1 = slight chance, 2 = moderate chance, 3 = high chance   Sitting and Reading? Watching Television? Sitting inactive in a public place (theater or meeting)? As a passenger in a car for an hour without a break? Lying down in the afternoon when circumstances permit? Sitting and talking to someone? Sitting quietly after lunch without alcohol? In a car, while stopped for a few minutes in traffic?   Total = 3/ 24 points   FSS endorsed at 31/ 63 points.   Social History  Socioeconomic History   Marital status: Married    Spouse name: Not on file   Number of children: 2   Years of education: Not on file   Highest education level: Not on file  Occupational History   Occupation: Mental Health Therapist  Tobacco Use   Smoking status: Never   Smokeless tobacco: Never  Vaping Use   Vaping Use: Never used  Substance and Sexual Activity   Alcohol use: Yes    Alcohol/week: 0.0 standard drinks of alcohol    Comment: rare   Drug use: No   Sexual activity: Not on file  Other Topics Concern   Not on file  Social History Narrative   Not on file   Social Determinants of Health   Financial Resource Strain: Not on file  Food Insecurity: Not on file  Transportation Needs: Not on  file  Physical Activity: Not on file  Stress: Not on file  Social Connections: Not on file    Family History  Adopted: Yes  Problem Relation Age of Onset   Colon cancer Father 96    Past Medical History:  Diagnosis Date   Allergy    spring    Anemia    yrs ago    Arthritis    Family history of colon cancer in father    Gastric ulcer    GERD (gastroesophageal reflux disease)    Hypertension    MVP (mitral valve prolapse)    Osteopenia     Past Surgical History:  Procedure Laterality Date   BREAST EXCISIONAL BIOPSY Right 1986   BREAST REDUCTION SURGERY     CHOLECYSTECTOMY     COLONOSCOPY     GASTRIC BYPASS     KNEE ARTHROSCOPY     right   REDUCTION MAMMAPLASTY Bilateral 2001   SKIN CANCER EXCISION     TEMPOROMANDIBULAR JOINT SURGERY     TONSILLECTOMY AND ADENOIDECTOMY     TUBAL LIGATION     UPPER GASTROINTESTINAL ENDOSCOPY       Current Outpatient Medications on File Prior to Visit  Medication Sig Dispense Refill   AMBULATORY NON FORMULARY MEDICATION Probioslim Extra Strength Probiotic, fat burner and bloat Tae 2 tablet by mouth daily     AUVI-Q 0.3 MG/0.3ML SOAJ injection Inject 0.3 mg into the muscle as needed.           B Complex Vitamins (VITAMIN B COMPLEX PO) Take 1 tablet by mouth daily.     CALCIUM PO Take 1 tablet by mouth daily. Adult chewable calcium supplement     DULoxetine (CYMBALTA) 30 MG capsule Take 1 capsule (30 mg total) by mouth daily. 30 capsule 0   DULoxetine (CYMBALTA) 60 MG capsule Take 1 capsule (60 mg total) by mouth daily. 30 capsule 5   Ferrous Sulfate (IRON PO) Take 1 tablet by mouth daily.     levocetirizine (XYZAL) 5 MG tablet Take 1 tablet by mouth daily.  3   lisinopril (ZESTRIL) 20 MG tablet TK 1 T PO D     losartan (COZAAR) 50 MG tablet TAKE 1 TABLET(50 MG) BY MOUTH DAILY 90 tablet 3   Multiple Vitamin (MULITIVITAMIN WITH MINERALS) TABS Take 3 tablets by mouth daily. Adult gummy multi-vitamins     omeprazole (PRILOSEC) 20 MG  capsule Take 20 mg by mouth daily.     Probiotic Product (PROBIOTIC DAILY PO) Take 1 capsule daily by mouth.     rosuvastatin (CRESTOR) 20 MG tablet Take 1 tablet (20 mg total) by mouth  daily. 90 tablet 3   VITAMIN D PO Take 1 tablet by mouth daily.     No current facility-administered medications on file prior to visit.    No Known Allergies  Physical exam:  Today's Vitals   03/04/22 1005  BP: 130/78  Pulse: (!) 55  Weight: 160 lb 8 oz (72.8 kg)  Height: '5\' 3"'$  (1.6 m)   Body mass index is 28.43 kg/m.   Wt Readings from Last 3 Encounters:  03/04/22 160 lb 8 oz (72.8 kg)  01/29/22 163 lb 6 oz (74.1 kg)  11/24/21 163 lb (73.9 kg)     Ht Readings from Last 3 Encounters:  03/04/22 '5\' 3"'$  (1.6 m)  01/29/22 '5\' 3"'$  (1.6 m)  11/24/21 '5\' 3"'$  (1.6 m)      General: The patient is awake, alert and appears not in acute distress. The patient is well groomed. Head: Normocephalic, atraumatic. Neck is supple. Mallampati 3,  neck circumference:13.5  inches . Nasal airflow patent.  small oral opening.  Dental status: crowded.   Cardiovascular:  Regular rate and cardiac rhythm by pulse,  without distended neck veins. Respiratory: Lungs are clear to auscultation.  Skin:  Without evidence of ankle edema, or rash. Trunk: The patient's posture is erect.   Neurologic exam : The patient is awake and alert, oriented to place and time.   Memory subjective described as intact.  Attention span & concentration ability appears normal.  Speech is fluent,  without  dysarthria, dysphonia or aphasia.  Mood and affect are appropriate.   Cranial nerves: no loss of smell or taste reported  Pupils are equal and briskly reactive to light. Funduscopic exam deferred...  Extraocular movements in vertical and horizontal planes were intact and without nystagmus. No Diplopia. Visual fields by finger perimetry are intact. Hearing was intact to soft voice and finger rubbing.    Facial sensation intact to fine  touch.  Facial motor strength is symmetric and tongue and uvula move midline.  Neck ROM : rotation, tilt and flexion extension were normal for age and shoulder shrug was symmetrical.    Motor exam:  Symmetric bulk, tone and ROM.  palpation of neck and shoulder showed no focal tension. Normal tone without cog wheeling, symmetric grip strength .   Sensory:  Fine touch, pinprick and vibration were tested  and  normal.  Proprioception tested in the upper extremities was normal.   Coordination: Rapid alternating movements in the fingers/hands were of normal speed. No changes in penmanship.   The Finger-to-nose maneuver was intact without evidence of ataxia, dysmetria or tremor.   Gait and station: Patient could rise unassisted from a seated position, walked without assistive device.  Stance is of normal width/ base and the patient turned with 3 steps.  Reports caution when stepping on an escalator.  Toe and heel walk were deferred.  Deep tendon reflexes: in the  upper and lower extremities are symmetric and intact.  Babinski response was deferred.       After spending a total time of  40  minutes face to face and additional time for physical and neurologic examination, review of laboratory studies,  personal review of imaging studies, reports and results of other testing and review of referral information / records as far as provided in visit, I have established the following assessments:  1)  Insomnia for sleep duration, difficult to sustain sleep after her one time nocturia.  2) new neck pain quality with tingling , numbness, electric current sensation  between the shoulder . 3) snoring.    My Plan is to proceed with:  1)  sleep apnea screening, the patient reports choking when she has a cold.  Snoring all year long.  2)  3) PSG and HST ordered.   I would like to thank Jacalyn Lefevre Jesse Sans, MD and Marcial Pacas, [address] [place],  [place] [address] for allowing me to meet with and to  take care of this pleasant patient.   In short, Faith [name] is presenting with snoring, choking, gasping. Marland Kitchena symptom that can be attributed to OSA, .   I plan to follow up either personally or through our NP within 2-4 months.   CC: I will share my notes with PCP/ Dr Krista Blue .  Electronically signed by: Larey Seat, MD 03/04/2022 10:21 AM  [facility] Board certified by The [employer] of Sleep Medicine and Diplomate of the [employer] of Sleep Medicine. Board certified In Neurology through the [place], Fellow of the [employer] of Neurology. Medical Director of [employer].

## 2022-03-10 ENCOUNTER — Other Ambulatory Visit: Payer: Self-pay

## 2022-03-10 ENCOUNTER — Encounter: Payer: Self-pay | Admitting: Physical Therapy

## 2022-03-10 ENCOUNTER — Ambulatory Visit: Payer: BC Managed Care – PPO | Admitting: Physical Therapy

## 2022-03-10 DIAGNOSIS — M6281 Muscle weakness (generalized): Secondary | ICD-10-CM | POA: Diagnosis not present

## 2022-03-10 DIAGNOSIS — R293 Abnormal posture: Secondary | ICD-10-CM

## 2022-03-10 DIAGNOSIS — M542 Cervicalgia: Secondary | ICD-10-CM | POA: Diagnosis not present

## 2022-03-10 NOTE — Therapy (Signed)
OUTPATIENT PHYSICAL THERAPY TREATMENT NOTE   Patient Name: Faith [name] MRN: [id number] DOB:October 23, 1955, 66 y.o., female Today's Date: 03/10/2022  PCP: Michael Boston, MD REFERRING PROVIDER: Marcial Pacas, MD  END OF SESSION:   PT End of Session - 03/10/22 1540     Visit Number 5    Number of Visits 9    Date for PT Re-Evaluation 04/10/22    Authorization Type BCBS    PT Start Time 1530    PT Stop Time 1615    PT Time Calculation (min) 45 min    Activity Tolerance Patient tolerated treatment well    Behavior During Therapy WFL for tasks assessed/performed                Past Medical History:  Diagnosis Date   Allergy    spring    Anemia    yrs ago    Arthritis    Family history of colon cancer in father    Gastric ulcer    GERD (gastroesophageal reflux disease)    Hypertension    MVP (mitral valve prolapse)    Osteopenia    Past Surgical History:  Procedure Laterality Date   BREAST EXCISIONAL BIOPSY Right 1986   BREAST REDUCTION SURGERY     CHOLECYSTECTOMY     COLONOSCOPY     GASTRIC BYPASS     KNEE ARTHROSCOPY     right   REDUCTION MAMMAPLASTY Bilateral 2001   SKIN CANCER EXCISION     TEMPOROMANDIBULAR JOINT SURGERY     TONSILLECTOMY AND ADENOIDECTOMY     TUBAL LIGATION     UPPER GASTROINTESTINAL ENDOSCOPY     Patient Active Problem List   Diagnosis Date Noted   Neck pain 01/29/2022   Snores 01/29/2022   Coronary artery calcification 10/10/2019   Hyperlipidemia 39/76/7341   Periumbilical pain 93/79/0240   Generalized abdominal pain 09/05/2019   Elevated lipase 09/05/2019   Chest pain of uncertain etiology 97/35/3299   Osteoarthritis of right knee 01/14/2016   Memory loss 08/22/2014   Hyperreflexia 08/22/2014   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 She saw a sleep doctor who told her she has a small neck.  PAIN:  Are you having pain? Yes:  NPRS scale: 5/10 (7/10 pain at worse) Pain location: Neck (base of scull) and bilateral shoulders (L > R) Pain description: Tightness, soreness, "nails" Aggravating factors: Unknown Relieving factors: Medication   OBJECTIVE: (objective measures completed at initial evaluation unless otherwise dated) PATIENT SURVEYS:  FOTO 53% functional status  03/10/2022: 58%   POSTURE:  Rounded shoulder and forward head posture   PALPATION: Tender to palpation bilateral upper trap, cervical paraspinals, suboccipitals              CERVICAL ROM:    Active ROM A/PROM (deg) eval 02/25/22  Flexion 70   Extension 30   Right lateral flexion 30   Left lateral flexion 25   Right rotation 55 55  Left rotation 40 60    UPPER EXTREMITY MMT:   MMT Right eval Left eval  Shoulder flexion 5 5  Shoulder  extension 5 5  Shoulder abduction 5 5  Shoulder internal rotation 5 5  Shoulder external rotation 5 5  Middle trapezius 4- 4-  Lower trapezius 4- 4-    CERVICAL SPECIAL TESTS:  Radicular testing grossly negative, patient does report muscular tightness at end range   FUNCTIONAL TESTS:  DNF endurance: 12 seconds     TODAY'S TREATMENT:  [facility] Adult PT Treatment:                                                DATE: 03/10/22 Therapeutic Exercise: UBE L3 x 4 min (2 fwd/bwd) while taking subjective Seated upper trap stretch 2 x 30 sec each Supine cervical retractions 10 x 5 sec Sidelying thoracic rotation x 10 each Seated thoracic extension x 10  Corner pec stretch 3 x 20 sec Manual Therapy: Skilled palpation and monitoring of muscle tension while performing TPDN STM bilateral cervical paraspinals,  upper traps, levator scapulae Suboccipital release with gentle manual traction Trigger Point Dry Needling Treatment: Pre-treatment instruction: Patient instructed on dry needling rationale, procedures, and possible side effects including pain during treatment (achy,cramping feeling), bruising, drop of blood, lightheadedness, nausea, sweating. Patient Consent Given: Yes Education handout provided: Previously provided Muscles treated: Bilateral upper trap, bilateral suboccipitals, right splenius cervicis  Needle size and number: .30x49m x 5 Treatment response/outcome: Twitch response elicited and Palpable decrease in muscle tension Post-treatment instructions: Patient instructed to expect possible mild to moderate muscle soreness later today and/or tomorrow. Patient instructed in methods to reduce muscle soreness and to continue prescribed HEP. If patient was dry needled over the lung field, patient was instructed on signs and symptoms of pneumothorax and, however unlikely, to see immediate medical attention should they occur. Patient was also educated on signs and symptoms of infection and to seek medical attention should they occur. Patient verbalized understanding of these instructions and education.   [facility]                                                [facility]: [facility] Therapeutic Exercise: UBE L3 x 4 min (2 fwd/bwd) while taking subjective Seated upper trap stretch 2 x 30 sec each Supine cervical retractions 10 x 5 sec Supine horizontal abduction with red while maintaining gentle chin tuck x 15 Supine scapular punch x 15 Sidelying thoracic rotation x 10 each Seated thoracic extension x 10  Manual Therapy: Skilled palpation and monitoring of muscle tension while performing TPDN STM bilateral cervical paraspinals, upper traps, levator scapulae Suboccipital release with gentle manual traction Trigger Point Dry Needling Treatment: Pre-treatment instruction: Patient  instructed on dry needling rationale, procedures, and possible side effects including pain during treatment (achy,cramping feeling), bruising, drop of blood, lightheadedness, nausea, sweating. Patient Consent Given: Yes Education handout provided: Previously provided Muscles treated: Bilateral upper trap Needle size and number: .30x581mx 5 Treatment response/outcome: Twitch response elicited and Palpable decrease in muscle tension Post-treatment instructions: Patient instructed to expect possible mild to moderate muscle soreness later today and/or tomorrow. Patient instructed in methods to reduce muscle soreness and to continue prescribed HEP. If patient was dry needled over the lung field, patient was instructed on signs and symptoms of pneumothorax and, however unlikely, to see immediate  medical attention should they occur. Patient was also educated on signs and symptoms of infection and to seek medical attention should they occur. Patient verbalized understanding of these instructions and education.  [facility] Adult PT Treatment:                                                DATE: 02/25/22 Therapeutic Exercise: UBE level 2 x 2 min each fwd/bwd  Cervical extension SNAG x 10  Cervical rotation SNAG  x 10 each  Seated cervical retraction x10  Prone scapular retraction 2 x 10  Seated thoracic extension x 10  Seated horizontal shoulder abduction red band 2 x 10  Updated HEP  Manual Therapy: STM bilateral cervical paraspinals, upper traps, levator scapulae Suboccipital release Trigger Point Dry Needling Treatment: Pre-treatment instruction: Patient instructed on dry needling rationale, procedures, and possible side effects including pain during treatment (achy,cramping feeling), bruising, drop of blood, lightheadedness, nausea, sweating. Patient Consent Given: Yes Education handout provided: Previously provided Muscles treated: cervical paraspinals and suboccipitals   Needle size and number:  .25x33m x 2 Treatment response/outcome: Twitch response elicited and Palpable decrease in muscle tension Post-treatment instructions: Patient instructed to expect possible mild to moderate muscle soreness later today and/or tomorrow. Patient instructed in methods to reduce muscle soreness and to continue prescribed HEP. If patient was dry needled over the lung field, patient was instructed on signs and symptoms of pneumothorax and, however unlikely, to see immediate medical attention should they occur. Patient was also educated on signs and symptoms of infection and to seek medical attention should they occur. Patient verbalized understanding of these instructions and education.   PATIENT EDUCATION:  Education details: HEP update, TPDN Person educated: Patient Education method: Explanation, Demonstration, Tactile cues, Verbal cues Education comprehension: verbalized understanding, returned demonstration, verbal cues required, tactile cues required, and needs further education   HOME EXERCISE PROGRAM: Access Code: [email or web address]    ASSESSMENT: CLINICAL IMPRESSION: Patient tolerated therapy well with no adverse effects. Continued with TPDN for suboccipital region and lower neck with good therapeutic benefit. Therapy continues to focus on progressing cervical mobility and postural control, and patient is reporting improvement in her symptoms with higher FOTO score reported this visit. Updated HEP to add postural stretching for home. Patient would benefit from continued skilled PT to progress her mobility and strength in order to reduce pain and maximize functional ability.    OBJECTIVE IMPAIRMENTS decreased activity tolerance, decreased endurance, decreased ROM, decreased strength, impaired flexibility, postural dysfunction, and pain.    ACTIVITY LIMITATIONS carrying, lifting, sitting, and hygiene/grooming   PARTICIPATION LIMITATIONS: meal prep, cleaning, driving, and occupation   PERSONAL FACTORS  Past/current experiences and Time since onset of injury/illness/exacerbation are also affecting patient's functional outcome.      GOALS: Goals reviewed with patient? Yes   SHORT TERM GOALS: Target date: 03/13/2022    Patient will be I with initial HEP in order to progress with therapy. Baseline: HEP provided at eval 03/03/2022: independent Goal status: MET   2.  PT will review FOTO with patient by 3rd visit in order to understand expected progress and outcome with therapy. Baseline: FOTO assessed at eval 03/03/2022: reviewed Goal status: MET   3.  Patient will report pain level </= 4/10 in order to reduce functional limitations Baseline: 7/10 03/03/2022: 5/10 pain level Goal status: PARTIALLY MET   LONG  TERM GOALS: Target date: 04/10/2022   Patient will be I with final HEP to maintain progress from PT. Baseline: HEP provided at eval Goal status: INITIAL   2.  Patient will report >/= 64% status on FOTO to indicate improved functional ability. Baseline: 53% functional status 03/10/2022: 58% Goal status: PARTIALLY MET   3.  Patient will demonstrate left cervical rotation AROM >/= 60 deg in order to improve driving ability. Baseline: 40 deg Goal status: INITIAL   4.  Patient will demonstrate DNF endurance >/= 30 seconds in order to improve postural control and reduce neck pain. Baseline: 12 seconds Goal status: INITIAL     PLAN: PT FREQUENCY: 1x/week   PT DURATION: 8 weeks   PLANNED INTERVENTIONS: Therapeutic exercises, Therapeutic activity, Neuromuscular re-education, Balance training, Gait training, Patient/Family education, Self Care, Joint mobilization, Joint manipulation, Aquatic Therapy, Dry Needling, Electrical stimulation, Spinal manipulation, Spinal mobilization, Cryotherapy, Moist heat, Taping, Manual therapy, and Re-evaluation   PLAN FOR NEXT SESSION: Review HEP and progress PRN, manual/dry needling for upper trap and neck region, cervical and thoracic  mobility, progress postural control   Hilda Blades, PT, DPT, LAT, ATC 03/10/22  4:17 PM Phone: [phone] Fax: [phone]

## 2022-03-17 ENCOUNTER — Telehealth: Payer: Self-pay | Admitting: Neurology

## 2022-03-17 ENCOUNTER — Other Ambulatory Visit: Payer: Self-pay

## 2022-03-17 ENCOUNTER — Encounter: Payer: Self-pay | Admitting: Physical Therapy

## 2022-03-17 ENCOUNTER — Ambulatory Visit: Payer: BC Managed Care – PPO | Admitting: Physical Therapy

## 2022-03-17 DIAGNOSIS — R293 Abnormal posture: Secondary | ICD-10-CM | POA: Diagnosis not present

## 2022-03-17 DIAGNOSIS — M6281 Muscle weakness (generalized): Secondary | ICD-10-CM | POA: Diagnosis not present

## 2022-03-17 DIAGNOSIS — M542 Cervicalgia: Secondary | ICD-10-CM

## 2022-03-17 NOTE — Telephone Encounter (Signed)
Patient returned my call..  I informed her that the insurance approved for her to have the HST. He informed me that Dr. Brett Fairy insisted that she have the in lab and not the HST because of her symptoms a in lab should be the one that needs to be performed. She stated that when she has a cold she gets mucus in her throat and then she starts choking from the mucus when she is sleeping. She stated because of that she is having heat/pain/numbness in her neck and shoulder and is now doing PT because of it. She believes not enough information was giving to [facility] and stated that it needs to be resubmitted with more information.  I informed her that with [employer] they typically don't approve a in lab unless a patient has had a history of a stroke and informed her that [place] like when patient's have HST first to see if they can find anything from that and then they are more likely to approve a HST. She did not like that and kept informing me that Dr. Brett Fairy told her that she insist's on her having a in lab.

## 2022-03-17 NOTE — Telephone Encounter (Signed)
I left a voice mail  explaining the policy for her [employer] plan is to have HST first and possibly PSG later - our documentation is clear for preference of an in lab test.   Larey Seat, MD

## 2022-03-17 NOTE — Telephone Encounter (Signed)
LVM for pt to call to schedule   [employer]BS auth: [id number] (exp. 03/09/22 to 05/07/22)

## 2022-03-17 NOTE — Therapy (Signed)
OUTPATIENT PHYSICAL THERAPY TREATMENT NOTE   Patient Name: Faith [name] MRN: [id number] DOB:16-Aug-1955, 66 y.o., female Today's Date: 03/17/2022  PCP: Michael Boston, MD REFERRING PROVIDER: Marcial Pacas, MD  END OF SESSION:   PT End of Session - 03/17/22 1604     Visit Number 6    Number of Visits 9    Date for PT Re-Evaluation 04/10/22    Authorization Type BCBS    PT Start Time 1620    PT Stop Time 4098    PT Time Calculation (min) 44 min    Activity Tolerance Patient tolerated treatment well    Behavior During Therapy WFL for tasks assessed/performed                 Past Medical History:  Diagnosis Date   Allergy    spring    Anemia    yrs ago    Arthritis    Family history of colon cancer in father    Gastric ulcer    GERD (gastroesophageal reflux disease)    Hypertension    MVP (mitral valve prolapse)    Osteopenia    Past Surgical History:  Procedure Laterality Date   BREAST EXCISIONAL BIOPSY Right 1986   BREAST REDUCTION SURGERY     CHOLECYSTECTOMY     COLONOSCOPY     GASTRIC BYPASS     KNEE ARTHROSCOPY     right   REDUCTION MAMMAPLASTY Bilateral 2001   SKIN CANCER EXCISION     TEMPOROMANDIBULAR JOINT SURGERY     TONSILLECTOMY AND ADENOIDECTOMY     TUBAL LIGATION     UPPER GASTROINTESTINAL ENDOSCOPY     Patient Active Problem List   Diagnosis Date Noted   Neck pain 01/29/2022   Snores 01/29/2022   Coronary artery calcification 10/10/2019   Hyperlipidemia 11/91/4782   Periumbilical pain 95/62/1308   Generalized abdominal pain 09/05/2019   Elevated lipase 09/05/2019   Chest pain of uncertain etiology 65/78/4696   Osteoarthritis of right knee 01/14/2016   Memory loss 08/22/2014   Hyperreflexia 08/22/2014   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   PAIN:  Are you having pain? Yes:  NPRS scale: 5/10 (7/10 pain at worse) Pain location: Neck (base of scull) and bilateral shoulders (L > R) Pain description: Tightness, soreness, "nails" Aggravating factors: Unknown Relieving factors: Medication   OBJECTIVE: (objective measures completed at initial evaluation unless otherwise dated) PATIENT SURVEYS:  FOTO 53% functional status  03/10/2022: 58%   POSTURE:  Rounded shoulder and forward head posture   PALPATION: Tender to palpation bilateral upper trap, cervical paraspinals, suboccipitals              CERVICAL ROM:    Active ROM A/PROM (deg) eval 02/25/22  Flexion 70   Extension 30   Right lateral flexion 30   Left lateral flexion 25   Right rotation 55 55  Left rotation 40 60    UPPER EXTREMITY MMT:   MMT Right eval Left eval  Shoulder flexion 5 5  Shoulder extension 5 5  Shoulder abduction 5 5  Shoulder internal rotation 5 5  Shoulder external rotation 5 5  Middle trapezius 4- 4-  Lower trapezius 4- 4-    CERVICAL SPECIAL TESTS:  Radicular testing grossly negative, patient does report muscular tightness at end range   FUNCTIONAL TESTS:  DNF endurance: 12 seconds     TODAY'S TREATMENT:  [facility] Adult PT Treatment:                                                DATE: 03/17/22 Therapeutic Exercise: UBE L3 x 4 min (2 fwd/bwd) while taking subjective Seated upper trap stretch 2 x 30 sec each Supine thoracic extension mobs over FR Sidelying thoracic rotation x 10 each Supine cervical retractions 10 x 5 sec Cat cow x 10 Corner pec stretch 3 x 20 sec Row with yellow elastisprings x 20 Manual Therapy: Skilled palpation and monitoring of muscle tension while performing TPDN STM bilateral  cervical paraspinals, upper traps, levator scapulae Suboccipital release with gentle manual traction Trigger Point Dry Needling Treatment: Pre-treatment instruction: Patient instructed on dry needling rationale, procedures, and possible side effects including pain during treatment (achy,cramping feeling), bruising, drop of blood, lightheadedness, nausea, sweating. Patient Consent Given: Yes Education handout provided: Previously provided Muscles treated: Bilateral upper trap, bilateral suboccipitals, right splenius cervicis  Needle size and number: .30x53m x 5 Treatment response/outcome: Twitch response elicited and Palpable decrease in muscle tension Post-treatment instructions: Patient instructed to expect possible mild to moderate muscle soreness later today and/or tomorrow. Patient instructed in methods to reduce muscle soreness and to continue prescribed HEP. If patient was dry needled over the lung field, patient was instructed on signs and symptoms of pneumothorax and, however unlikely, to see immediate medical attention should they occur. Patient was also educated on signs and symptoms of infection and to seek medical attention should they occur. Patient verbalized understanding of these instructions and education.   [facility]                                                [facility]: [facility] Therapeutic Exercise: UBE L3 x 4 min (2 fwd/bwd) while taking subjective Seated upper trap stretch 2 x 30 sec each Supine cervical retractions 10 x 5 sec Sidelying thoracic rotation x 10 each Seated thoracic extension x 10  Corner pec stretch 3 x 20 sec Manual Therapy: Skilled palpation and monitoring of muscle tension while performing TPDN STM bilateral cervical paraspinals, upper traps, levator scapulae Suboccipital release with gentle manual traction Trigger Point Dry Needling Treatment: Pre-treatment instruction: Patient instructed on dry needling rationale, procedures, and  possible side effects including pain during treatment (achy,cramping feeling), bruising, drop of blood, lightheadedness, nausea, sweating. Patient Consent Given: Yes Education handout provided: Previously provided Muscles treated: Bilateral upper trap, bilateral suboccipitals, right splenius cervicis  Needle size and number: .30x542mx 5 Treatment response/outcome: Twitch response elicited and Palpable decrease in muscle tension Post-treatment instructions: Patient instructed to expect possible mild to moderate muscle soreness later today and/or tomorrow. Patient instructed in methods to reduce muscle soreness and to continue prescribed HEP. If patient was dry needled over the lung field, patient was instructed on signs and symptoms of pneumothorax and, however unlikely, to see immediate medical attention should  they occur. Patient was also educated on signs and symptoms of infection and to seek medical attention should they occur. Patient verbalized understanding of these instructions and education.  [facility] Adult PT Treatment:                                                DATE: 03/03/22 Therapeutic Exercise: UBE L3 x 4 min (2 fwd/bwd) while taking subjective Seated upper trap stretch 2 x 30 sec each Supine cervical retractions 10 x 5 sec Supine horizontal abduction with red while maintaining gentle chin tuck x 15 Supine scapular punch x 15 Sidelying thoracic rotation x 10 each Seated thoracic extension x 10  Manual Therapy: Skilled palpation and monitoring of muscle tension while performing TPDN STM bilateral cervical paraspinals, upper traps, levator scapulae Suboccipital release with gentle manual traction Trigger Point Dry Needling Treatment: Pre-treatment instruction: Patient instructed on dry needling rationale, procedures, and possible side effects including pain during treatment (achy,cramping feeling), bruising, drop of blood, lightheadedness, nausea, sweating. Patient Consent Given:  Yes Education handout provided: Previously provided Muscles treated: Bilateral upper trap Needle size and number: .30x15m x 5 Treatment response/outcome: Twitch response elicited and Palpable decrease in muscle tension Post-treatment instructions: Patient instructed to expect possible mild to moderate muscle soreness later today and/or tomorrow. Patient instructed in methods to reduce muscle soreness and to continue prescribed HEP. If patient was dry needled over the lung field, patient was instructed on signs and symptoms of pneumothorax and, however unlikely, to see immediate medical attention should they occur. Patient was also educated on signs and symptoms of infection and to seek medical attention should they occur. Patient verbalized understanding of these instructions and education.   PATIENT EDUCATION:  Education details: HEP, TPDN Person educated: Patient Education method: Explanation, Demonstration, Tactile cues, Verbal cues Education comprehension: verbalized understanding, returned demonstration, verbal cues required, tactile cues required, and needs further education   HOME EXERCISE PROGRAM: Access Code: [email or web address]    ASSESSMENT: CLINICAL IMPRESSION: Patient tolerated therapy well with no adverse effects. She reports overall improvement but notes increased tightness and nerve damage sensation today. Continued with TPDN with good therapeutic benefit and therapy focused on improving mobility and progressing postural control. She did require cueing to avoid shoulder shrug with rows. No changes to HEP this visit. Patient would benefit from continued skilled PT to progress her mobility and strength in order to reduce pain and maximize functional ability.     OBJECTIVE IMPAIRMENTS decreased activity tolerance, decreased endurance, decreased ROM, decreased strength, impaired flexibility, postural dysfunction, and pain.    ACTIVITY LIMITATIONS carrying, lifting, sitting, and  hygiene/grooming   PARTICIPATION LIMITATIONS: meal prep, cleaning, driving, and occupation   PERSONAL FACTORS Past/current experiences and Time since onset of injury/illness/exacerbation are also affecting patient's functional outcome.      GOALS: Goals reviewed with patient? Yes   SHORT TERM GOALS: Target date: 03/13/2022    Patient will be I with initial HEP in order to progress with therapy. Baseline: HEP provided at eval 03/03/2022: independent Goal status: MET   2.  PT will review FOTO with patient by 3rd visit in order to understand expected progress and outcome with therapy. Baseline: FOTO assessed at eval 03/03/2022: reviewed Goal status: MET   3.  Patient will report pain level </= 4/10 in order to reduce functional limitations Baseline: 7/10 03/03/2022: 5/10 pain  level Goal status: PARTIALLY MET   LONG TERM GOALS: Target date: 04/10/2022   Patient will be I with final HEP to maintain progress from PT. Baseline: HEP provided at eval Goal status: INITIAL   2.  Patient will report >/= 64% status on FOTO to indicate improved functional ability. Baseline: 53% functional status 03/10/2022: 58% Goal status: PARTIALLY MET   3.  Patient will demonstrate left cervical rotation AROM >/= 60 deg in order to improve driving ability. Baseline: 40 deg Goal status: INITIAL   4.  Patient will demonstrate DNF endurance >/= 30 seconds in order to improve postural control and reduce neck pain. Baseline: 12 seconds Goal status: INITIAL     PLAN: PT FREQUENCY: 1x/week   PT DURATION: 8 weeks   PLANNED INTERVENTIONS: Therapeutic exercises, Therapeutic activity, Neuromuscular re-education, Balance training, Gait training, Patient/Family education, Self Care, Joint mobilization, Joint manipulation, Aquatic Therapy, Dry Needling, Electrical stimulation, Spinal manipulation, Spinal mobilization, Cryotherapy, Moist heat, Taping, Manual therapy, and Re-evaluation   PLAN FOR NEXT  SESSION: Review HEP and progress PRN, manual/dry needling for upper trap and neck region, cervical and thoracic mobility, progress postural control   Hilda Blades, PT, DPT, LAT, ATC 03/17/22  5:08 PM Phone: [phone] Fax: [phone]

## 2022-03-20 DIAGNOSIS — J3089 Other allergic rhinitis: Secondary | ICD-10-CM | POA: Diagnosis not present

## 2022-03-20 DIAGNOSIS — J301 Allergic rhinitis due to pollen: Secondary | ICD-10-CM | POA: Diagnosis not present

## 2022-03-20 DIAGNOSIS — J3081 Allergic rhinitis due to animal (cat) (dog) hair and dander: Secondary | ICD-10-CM | POA: Diagnosis not present

## 2022-03-24 ENCOUNTER — Ambulatory Visit: Payer: BC Managed Care – PPO | Attending: Neurology | Admitting: Physical Therapy

## 2022-03-24 DIAGNOSIS — M6281 Muscle weakness (generalized): Secondary | ICD-10-CM | POA: Insufficient documentation

## 2022-03-24 DIAGNOSIS — R293 Abnormal posture: Secondary | ICD-10-CM | POA: Insufficient documentation

## 2022-03-24 DIAGNOSIS — M542 Cervicalgia: Secondary | ICD-10-CM | POA: Insufficient documentation

## 2022-03-24 NOTE — Therapy (Incomplete)
OUTPATIENT PHYSICAL THERAPY TREATMENT NOTE   Patient Name: Faith [name] MRN: [id number] DOB:Jan 11, 1956, 66 y.o., female Today's Date: 03/24/2022  PCP: Michael Boston, MD REFERRING PROVIDER: Marcial Pacas, MD  END OF SESSION:         Past Medical History:  Diagnosis Date   Allergy    spring    Anemia    yrs ago    Arthritis    Family history of colon cancer in father    Gastric ulcer    GERD (gastroesophageal reflux disease)    Hypertension    MVP (mitral valve prolapse)    Osteopenia    Past Surgical History:  Procedure Laterality Date   BREAST EXCISIONAL BIOPSY Right 1986   BREAST REDUCTION SURGERY     CHOLECYSTECTOMY     COLONOSCOPY     GASTRIC BYPASS     KNEE ARTHROSCOPY     right   REDUCTION MAMMAPLASTY Bilateral 2001   SKIN CANCER EXCISION     TEMPOROMANDIBULAR JOINT SURGERY     TONSILLECTOMY AND ADENOIDECTOMY     TUBAL LIGATION     UPPER GASTROINTESTINAL ENDOSCOPY     Patient Active Problem List   Diagnosis Date Noted   Neck pain 01/29/2022   Snores 01/29/2022   Coronary artery calcification 10/10/2019   Hyperlipidemia 63/78/5885   Periumbilical pain 02/77/4128   Generalized abdominal pain 09/05/2019   Elevated lipase 09/05/2019   Chest pain of uncertain etiology 78/67/6720   Osteoarthritis of right knee 01/14/2016   Memory loss 08/22/2014   Hyperreflexia 08/22/2014   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  Rationale for Evaluation and Treatment Rehabilitation  PERTINENT HISTORY: chronic neck pain   PRECAUTIONS: none   SUBJECTIVE: Patient reports she had a few days without the nerve damage feeling but then she woke up this morning and it was back.   PAIN:  Are you having pain? Yes:  NPRS scale: 5/10 (7/10 pain at worse) Pain location: Neck (base of scull) and bilateral shoulders (L > R) Pain description: Tightness,  soreness, "nails" Aggravating factors: Unknown Relieving factors: Medication   OBJECTIVE: (objective measures completed at initial evaluation unless otherwise dated) PATIENT SURVEYS:  FOTO 53% functional status  03/10/2022: 58%   POSTURE:  Rounded shoulder and forward head posture   PALPATION: Tender to palpation bilateral upper trap, cervical paraspinals, suboccipitals              CERVICAL ROM:    Active ROM A/PROM (deg) eval 02/25/22  Flexion 70   Extension 30   Right lateral flexion 30   Left lateral flexion 25   Right rotation 55 55  Left rotation 40 60    UPPER EXTREMITY MMT:   MMT Right eval Left eval  Shoulder flexion 5 5  Shoulder extension 5 5  Shoulder abduction 5 5  Shoulder internal rotation 5 5  Shoulder external rotation 5 5  Middle trapezius 4- 4-  Lower trapezius 4- 4-    CERVICAL SPECIAL TESTS:  Radicular testing grossly negative, patient does report muscular tightness at end range   FUNCTIONAL TESTS:  DNF endurance: 12 seconds     TODAY'S TREATMENT:  [facility] Adult PT Treatment:  DATE: 03/24/22 Therapeutic Exercise: UBE L3 x 4 min (2 fwd/bwd) while taking subjective Seated upper trap stretch 2 x 30 sec each Supine thoracic extension mobs over FR Sidelying thoracic rotation x 10 each Supine cervical retractions 10 x 5 sec Cat cow x 10 Corner pec stretch 3 x 20 sec Row with yellow elastisprings x 20 Manual Therapy: Skilled palpation and monitoring of muscle tension while performing TPDN STM bilateral cervical paraspinals, upper traps, levator scapulae Suboccipital release with gentle manual traction Trigger Point Dry Needling Treatment: Pre-treatment instruction: Patient instructed on dry needling rationale, procedures, and possible side effects including pain during treatment (achy,cramping feeling), bruising, drop of blood, lightheadedness, nausea, sweating. Patient Consent Given:  Yes Education handout provided: Previously provided Muscles treated: Bilateral upper trap, bilateral suboccipitals, right splenius cervicis  Needle size and number: .30x27m x 5 Treatment response/outcome: Twitch response elicited and Palpable decrease in muscle tension Post-treatment instructions: Patient instructed to expect possible mild to moderate muscle soreness later today and/or tomorrow. Patient instructed in methods to reduce muscle soreness and to continue prescribed HEP. If patient was dry needled over the lung field, patient was instructed on signs and symptoms of pneumothorax and, however unlikely, to see immediate medical attention should they occur. Patient was also educated on signs and symptoms of infection and to seek medical attention should they occur. Patient verbalized understanding of these instructions and education.   [facility]                                                [facility]: 03[facility]22 Therapeutic Exercise: UBE L3 x 4 min (2 fwd/bwd) while taking subjective Seated upper trap stretch 2 x 30 sec each Supine thoracic extension mobs over FR Sidelying thoracic rotation x 10 each Supine cervical retractions 10 x 5 sec Cat cow x 10 Corner pec stretch 3 x 20 sec Row with yellow elastisprings x 20 Manual Therapy: Skilled palpation and monitoring of muscle tension while performing TPDN STM bilateral cervical paraspinals, upper traps, levator scapulae Suboccipital release with gentle manual traction Trigger Point Dry Needling Treatment: Pre-treatment instruction: Patient instructed on dry needling rationale, procedures, and possible side effects including pain during treatment (achy,cramping feeling), bruising, drop of blood, lightheadedness, nausea, sweating. Patient Consent Given: Yes Education handout provided: Previously provided Muscles treated: Bilateral upper trap, bilateral suboccipitals, right splenius cervicis  Needle size and number: .30x553mx  5 Treatment response/outcome: Twitch response elicited and Palpable decrease in muscle tension Post-treatment instructions: Patient instructed to expect possible mild to moderate muscle soreness later today and/or tomorrow. Patient instructed in methods to reduce muscle soreness and to continue prescribed HEP. If patient was dry needled over the lung field, patient was instructed on signs and symptoms of pneumothorax and, however unlikely, to see immediate medical attention should they occur. Patient was also educated on signs and symptoms of infection and to seek medical attention should they occur. Patient verbalized understanding of these instructions and education.  [facility]                                                [facility]: 03/[facility]22 Therapeutic Exercise: UBE L3 x 4 min (2 fwd/bwd) while taking subjective Seated upper trap  stretch 2 x 30 sec each Supine cervical retractions 10 x 5 sec Sidelying thoracic rotation x 10 each Seated thoracic extension x 10  Corner pec stretch 3 x 20 sec Manual Therapy: Skilled palpation and monitoring of muscle tension while performing TPDN STM bilateral cervical paraspinals, upper traps, levator scapulae Suboccipital release with gentle manual traction Trigger Point Dry Needling Treatment: Pre-treatment instruction: Patient instructed on dry needling rationale, procedures, and possible side effects including pain during treatment (achy,cramping feeling), bruising, drop of blood, lightheadedness, nausea, sweating. Patient Consent Given: Yes Education handout provided: Previously provided Muscles treated: Bilateral upper trap, bilateral suboccipitals, right splenius cervicis  Needle size and number: .30x32m x 5 Treatment response/outcome: Twitch response elicited and Palpable decrease in muscle tension Post-treatment instructions: Patient instructed to expect possible mild to moderate muscle soreness later today and/or tomorrow. Patient  instructed in methods to reduce muscle soreness and to continue prescribed HEP. If patient was dry needled over the lung field, patient was instructed on signs and symptoms of pneumothorax and, however unlikely, to see immediate medical attention should they occur. Patient was also educated on signs and symptoms of infection and to seek medical attention should they occur. Patient verbalized understanding of these instructions and education.   PATIENT EDUCATION:  Education details: HEP, TPDN Person educated: Patient Education method: Explanation, Demonstration, Tactile cues, Verbal cues Education comprehension: verbalized understanding, returned demonstration, verbal cues required, tactile cues required, and needs further education   HOME EXERCISE PROGRAM: Access Code: [email or web address]    ASSESSMENT: CLINICAL IMPRESSION: Patient tolerated therapy well with no adverse effects. *** Patient would benefit from continued skilled PT to progress her mobility and strength in order to reduce pain and maximize functional ability.   She reports overall improvement but notes increased tightness and nerve damage sensation today. Continued with TPDN with good therapeutic benefit and therapy focused on improving mobility and progressing postural control. She did require cueing to avoid shoulder shrug with rows. No changes to HEP this visit.     OBJECTIVE IMPAIRMENTS decreased activity tolerance, decreased endurance, decreased ROM, decreased strength, impaired flexibility, postural dysfunction, and pain.    ACTIVITY LIMITATIONS carrying, lifting, sitting, and hygiene/grooming   PARTICIPATION LIMITATIONS: meal prep, cleaning, driving, and occupation   PERSONAL FACTORS Past/current experiences and Time since onset of injury/illness/exacerbation are also affecting patient's functional outcome.      GOALS: Goals reviewed with patient? Yes   SHORT TERM GOALS: Target date: 03/13/2022    Patient will be I with  initial HEP in order to progress with therapy. Baseline: HEP provided at eval 03/03/2022: independent Goal status: MET   2.  PT will review FOTO with patient by 3rd visit in order to understand expected progress and outcome with therapy. Baseline: FOTO assessed at eval 03/03/2022: reviewed Goal status: MET   3.  Patient will report pain level </= 4/10 in order to reduce functional limitations Baseline: 7/10 03/03/2022: 5/10 pain level Goal status: PARTIALLY MET   LONG TERM GOALS: Target date: 04/10/2022   Patient will be I with final HEP to maintain progress from PT. Baseline: HEP provided at eval Goal status: INITIAL   2.  Patient will report >/= 64% status on FOTO to indicate improved functional ability. Baseline: 53% functional status 03/10/2022: 58% Goal status: PARTIALLY MET   3.  Patient will demonstrate left cervical rotation AROM >/= 60 deg in order to improve driving ability. Baseline: 40 deg Goal status: INITIAL   4.  Patient will demonstrate DNF endurance >/=  30 seconds in order to improve postural control and reduce neck pain. Baseline: 12 seconds Goal status: INITIAL     PLAN: PT FREQUENCY: 1x/week   PT DURATION: 8 weeks   PLANNED INTERVENTIONS: Therapeutic exercises, Therapeutic activity, Neuromuscular re-education, Balance training, Gait training, Patient/Family education, Self Care, Joint mobilization, Joint manipulation, Aquatic Therapy, Dry Needling, Electrical stimulation, Spinal manipulation, Spinal mobilization, Cryotherapy, Moist heat, Taping, Manual therapy, and Re-evaluation   PLAN FOR NEXT SESSION: Review HEP and progress PRN, manual/dry needling for upper trap and neck region, cervical and thoracic mobility, progress postural control   Hilda Blades, PT, DPT, LAT, ATC 03/24/22  3:47 PM Phone: [phone] Fax: [phone]

## 2022-03-25 NOTE — Telephone Encounter (Signed)
HST- [employer] Josem Kaufmann: [id number] (exp. 03/09/22 to 05/07/22).  Patient is scheduled at [facility] for 1 pm.  Mailed packet to the patient.

## 2022-03-25 NOTE — Telephone Encounter (Signed)
HST- [employer] Josem Kaufmann: [id number] (exp. 03/09/22 to 05/07/22).  Patient is scheduled at [facility] for 1 pm.  Mailed packet to the patient.

## 2022-03-27 ENCOUNTER — Other Ambulatory Visit: Payer: Self-pay | Admitting: Internal Medicine

## 2022-03-27 DIAGNOSIS — Z1231 Encounter for screening mammogram for malignant neoplasm of breast: Secondary | ICD-10-CM

## 2022-03-29 NOTE — Progress Notes (Unsigned)
04/02/2022 ELIE GRAGERT [id number] 04-06-1956  Referring provider: Michael Boston, MD Primary GI doctor: Dr. Silverio Decamp  ASSESSMENT AND PLAN:   Rectal bleeding with history of adenomatous polyps and family history of colon cancer and internal hemorrhoids 03/2017 Colonoscopy at same time showed nonbleeding internal hemorrhoids, 4 mm tubular adenomatous polyp ascending colon 2 polyps 1 to 2 mm size sigmoid and cecum, 1 of which was hyperplastic the other tubular adenoma recall 5 years- due for colonoscopy and with rectal bleeding, likely internal hemorrhoids but will schedule colonoscopy.  We have discussed the risks of bleeding, infection, perforation, medication reactions, and remote risk of death associated with colonoscopy. All questions were answered and the patient acknowledges these risk and wishes to proceed. -Sitz baths, increase fiber, increase water  Esophageal dysphagia possible silent reflux No dysphagia with liquids, dysphagia with multivitamins and solids, not improved on PPI. Possible sleep apnea contributing, worse with a cold, getting evaluated with sleep study by Dr. Brett Fairy. EGD with dilatation to evaluate for stenosis, tumor, erosive/infectious esophagititis, and EOE.   If the EGD is negative and symptoms continue after PPI trial/sleep study, can consider barium swallow Continue Prilosec 20 mg once daily to 30 minutes before food. I discussed risks of EGD with patient today, including risk of sedation, bleeding or perforation.  Patient provides understanding and gave verbal consent to proceed.  B12 deficiency and low ferritin 2021 She is on supplements, super B pills and iron pill Has been told to take both since 2021, but not rechecked Recheck B12/iron/ferritin Scheduled for both EGD and colon  Patient Care Team: Michael Boston, MD as PCP - General (Internal Medicine) Nahser, Wonda Cheng, MD as PCP - Cardiology (Cardiology)  HISTORY OF PRESENT ILLNESS: 66  y.o. female with a past medical history of gastric bypass and cholecystectomy 2009, history of pancreatitis 2021, GERD, anemia with family history of colon cancer in father and others listed below presents for evaluation of EGD and colonoscopy.   2018 EGD unremarkable. 03/2017 Colonoscopy at same time showed nonbleeding internal hemorrhoids, 4 mm tubular adenomatous polyp ascending colon 2 polyps 1 to 2 mm size sigmoid and cecum, 1 of which was hyperplastic the other tubular adenoma recall 5 years. 03/2022 Labs from 2021 showed B12 396, iron deficiency, suggested rechecking ferritin and B12 but did not see this done.  Just recently had some BRB with BM.  She has "small mouth and esophagus", going to be getting sleep study done. If she gets a cold she will choke/cough even during the day/worse at night with sleeping.  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   She states she had cold several weeks ago, had coughing/choking every night. She states she has a "nerve" damage episode one night. Then suddenly she had severe heat and itching, "electric shock" in upper and middle of her back. States this was several months ago and she continues to have this pain. Was stabbing pain but it is now persistent itching in the middle of her spine.  Has been getting dry needling with PT for neck pain, had MRI cervical spine.   She states with MVITs and with eating food, she has to be very careful, chew well and is very careful not to choke. Never happens with liquids.  She has a chronic cough, has seen allergist and told it was GERD.  She was told by him a year ago to go back on her PPI and states  this has helped the cough but continues to have dysphagia.  Denies nausea, vomiting. No AB pain.  BM daily no constipation, no diarrhea.   She  reports that she has never smoked. She has never used smokeless tobacco. She reports current alcohol use. She reports that she does not use  drugs.  CT abdomen pelvis with contrast Sep 22, 2019 Mass lesion in the right hepatic lobe consistent with benign focal nodular hyperplasia, no significant change in size as compared to prior MRI in 2018 S/p cholecystectomy.  Normal pancreas. Stable postop changes s/p gastric bypass surgery Stable benign appearing left adrenal adenoma Uterine fibroid   EGD 04/02/2017: - Normal esophagus. - Roux-en-Y gastrojejunostomy with gastrojejunal anastomosis characterized by healthy appearing mucosa.  - Normal examined jejunum.   Colonoscopy 04/02/2017:  - One 4 mm polyp in the ascending colon, removed with a cold snare. Resected and retrieved. - Two 1 to 2 mm polyps in the sigmoid colon and in the cecum, removed with a cold biopsy forceps. - Non-bleeding internal hemorrhoids.  -5 year recall.    Biologic father had colon cancer at age 63   Biopsies:  28. Surgical [P], ascending and cecum, polyp (2) - TUBULAR ADENOMA(S). - HIGH GRADE DYSPLASIA IS NOT IDENTIFIED. 2. Surgical [P], sigmoid, polyp - HYPERPLASTIC POLYP(S). - THERE IS NO EVIDENCE OF MALIGNANCY   Abdominal MRI 02/16/2017: 1. Hypervascular 2.4 cm inferior right liver lobe mass is stable since 04/01/2016 CT abdomen study and demonstrates MRI features compatible with benign focal nodular hyperplasia ([place]). 2. Left adrenal adenoma. 3. No acute abnormality  Current Medications:    Current Outpatient Medications (Cardiovascular):    AUVI-Q 0.3 MG/0.3ML SOAJ injection, Inject 0.3 mg into the muscle as needed.   lisinopril (ZESTRIL) 20 MG tablet, TK 1 T PO D   losartan (COZAAR) 50 MG tablet, TAKE 1 TABLET(50 MG) BY MOUTH DAILY   rosuvastatin (CRESTOR) 20 MG tablet, Take 1 tablet (20 mg total) by mouth daily.  Current Outpatient Medications (Respiratory):    azelastine (ASTELIN) 0.1 % nasal spray,    levocetirizine (XYZAL) 5 MG tablet, Take 1 tablet by mouth daily.   Current Outpatient Medications (Hematological):    Ferrous  Sulfate (IRON PO), Take 1 tablet by mouth daily.  Current Outpatient Medications (Other):    AMBULATORY NON FORMULARY MEDICATION, Probioslim Extra Strength Probiotic, fat burner and bloat Tae 2 tablet by mouth daily   B Complex Vitamins (VITAMIN B COMPLEX PO), Take 1 tablet by mouth daily.   CALCIUM PO, Take 1 tablet by mouth daily. Adult chewable calcium supplement   DULoxetine (CYMBALTA) 30 MG capsule, Take 1 capsule (30 mg total) by mouth daily.   DULoxetine (CYMBALTA) 60 MG capsule, Take 1 capsule (60 mg total) by mouth daily.   Multiple Vitamin (MULITIVITAMIN WITH MINERALS) TABS, Take 3 tablets by mouth daily. Adult gummy multi-vitamins   omeprazole (PRILOSEC) 20 MG capsule, Take 20 mg by mouth daily.   Probiotic Product (PROBIOTIC DAILY PO), Take 1 capsule daily by mouth.   traZODone (DESYREL) 50 MG tablet, Take 1 tablet (50 mg total) by mouth at bedtime as needed for sleep.   VITAMIN D PO, Take 1 tablet by mouth daily.  Medical History:  Past Medical History:  Diagnosis Date   Allergy    spring    Anemia    yrs ago    Arthritis    Family history of colon cancer in father    Gastric ulcer    GERD (gastroesophageal reflux disease)  Hypertension    MVP (mitral valve prolapse)    Nerve damage 2023   Osteopenia    Allergies: No Known Allergies   Surgical History:  She  has a past surgical history that includes Gastric bypass; Knee arthroscopy; Cholecystectomy; Breast reduction surgery; Tonsillectomy and adenoidectomy; Tubal ligation; Temporomandibular joint surgery; Upper gastrointestinal endoscopy; Colonoscopy; Reduction mammaplasty (Bilateral, 2001); Breast excisional biopsy (Right, 1986); and Skin cancer excision. Family History:  Her family history includes Colon cancer (age of onset: 50) in her father. She was adopted.  REVIEW OF SYSTEMS  : All other systems reviewed and negative except where noted in the History of Present Illness.  PHYSICAL EXAM: BP 120/88   Pulse  60   Ht '5\' 3"'$  (1.6 m)   Wt 161 lb (73 kg)   SpO2 96%   BMI 28.52 kg/m  General:   Pleasant, well developed female in no acute distress Head:   Normocephalic and atraumatic. Eyes:  sclerae anicteric,conjunctive pink  Heart:   regular rate and rhythm Pulm:  Clear anteriorly; no wheezing Abdomen:   Soft, Non-distended AB, Active bowel sounds. No tenderness . Without guarding and Without rebound, No organomegaly appreciated. Rectal: Not evaluated Extremities:  Without edema. Msk: Symmetrical without gross deformities. Peripheral pulses intact.  Neurologic:  Alert and  oriented x4;  No focal deficits.  Skin:   Dry and intact without significant lesions or rashes. Psychiatric:  Cooperative. Normal mood and affect.    Vladimir Crofts, PA-C 2:04 PM

## 2022-03-31 ENCOUNTER — Ambulatory Visit: Payer: BC Managed Care – PPO | Admitting: Physical Therapy

## 2022-03-31 ENCOUNTER — Encounter: Payer: Self-pay | Admitting: Physical Therapy

## 2022-03-31 ENCOUNTER — Other Ambulatory Visit: Payer: Self-pay

## 2022-03-31 DIAGNOSIS — M542 Cervicalgia: Secondary | ICD-10-CM

## 2022-03-31 DIAGNOSIS — M6281 Muscle weakness (generalized): Secondary | ICD-10-CM

## 2022-03-31 DIAGNOSIS — R293 Abnormal posture: Secondary | ICD-10-CM | POA: Diagnosis not present

## 2022-03-31 NOTE — Patient Instructions (Signed)
Access Code: [email or web address] URL: [email or web address] Date: 03/31/2022 Prepared by: Hilda Blades  Exercises - Supine Cervical Retraction with Towel  - 1-2 x daily - 2 sets - 10 reps - 5 seconds hold - Sidelying Thoracic Lumbar Rotation  - 1-2 x daily - 2 sets - 10 reps - 5 seconds hold - Standing Row with Anchored Resistance  - 1-2 x daily - 2 sets - 20 reps - Upper Cervical Extension SNAG with Strap  - 1 x daily - 7 x weekly - 1 sets - 10 reps - Seated Assisted Cervical Rotation with Towel  - 1 x daily - 7 x weekly - 1 sets - 10 reps - Prone Scapular Retraction  - 1 x daily - 7 x weekly - 2 sets - 10 reps - Seated Thoracic Lumbar Extension with Pectoralis Stretch  - 1 x daily - 7 x weekly - 1 sets - 10 reps - 3 sec  hold - Seated Shoulder Horizontal Abduction with Resistance  - 1 x daily - 7 x weekly - 2 sets - 10 reps - Corner Pec Major Stretch  - 1-2 x daily - 3 sets - 20 seconds hold - Kneeling Thoracic Extension Stretch with Swiss Ball  - 1-2 x daily - 10 reps - 5 seconds hold

## 2022-03-31 NOTE — Therapy (Signed)
OUTPATIENT PHYSICAL THERAPY TREATMENT NOTE   Patient Name: Faith [name] MRN: [id number] DOB:[birth year], 66 y.o., female Today's Date: 03/31/2022  PCP: Michael Boston, MD REFERRING PROVIDER: Marcial Pacas, MD  END OF SESSION:   PT End of Session - 03/31/22 1624     Visit Number 7    Number of Visits 9    Date for PT Re-Evaluation 04/10/22    Authorization Type BCBS    PT Start Time 1620    PT Stop Time 1700    PT Time Calculation (min) 40 min    Activity Tolerance Patient tolerated treatment well    Behavior During Therapy WFL for tasks assessed/performed                  Past Medical History:  Diagnosis Date   Allergy    spring    Anemia    yrs ago    Arthritis    Family history of colon cancer in father    Gastric ulcer    GERD (gastroesophageal reflux disease)    Hypertension    MVP (mitral valve prolapse)    Osteopenia    Past Surgical History:  Procedure Laterality Date   BREAST EXCISIONAL BIOPSY Right 1986   BREAST REDUCTION SURGERY     CHOLECYSTECTOMY     COLONOSCOPY     GASTRIC BYPASS     KNEE ARTHROSCOPY     right   REDUCTION MAMMAPLASTY Bilateral 2001   SKIN CANCER EXCISION     TEMPOROMANDIBULAR JOINT SURGERY     TONSILLECTOMY AND ADENOIDECTOMY     TUBAL LIGATION     UPPER GASTROINTESTINAL ENDOSCOPY     Patient Active Problem List   Diagnosis Date Noted   Neck pain 01/29/2022   Snores 01/29/2022   Coronary artery calcification 10/10/2019   Hyperlipidemia 90/24/0973   Periumbilical pain 53/29/9242   Generalized abdominal pain 09/05/2019   Elevated lipase 09/05/2019   Chest pain of uncertain etiology 68/34/1962   Osteoarthritis of right knee 01/14/2016   Memory loss 08/22/2014   Hyperreflexia 08/22/2014   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   Lap Chole [facility] in [facility] 06/17/2012    REFERRING DIAG: Neck pain  THERAPY DIAG:  Cervicalgia  Abnormal posture  Muscle weakness (generalized)  Rationale for Evaluation  and Treatment Rehabilitation  PERTINENT HISTORY: chronic neck pain   PRECAUTIONS: none   SUBJECTIVE: Patient reports she is doing well, she feels like she continues to improve. She accidentally forget about her appointment last week.  PAIN:  Are you having pain? Yes:  NPRS scale: 4/10 (7/10 pain at worse) Pain location: Neck (base of scull) and bilateral shoulders (L > R) Pain description: Tightness, soreness, "nails" Aggravating factors: Unknown Relieving factors: Medication   OBJECTIVE: (objective measures completed at initial evaluation unless otherwise dated) PATIENT SURVEYS:  FOTO 53% functional status  03/10/2022: 58%   POSTURE:  Rounded shoulder and forward head posture   PALPATION: Tender to palpation bilateral upper trap, cervical paraspinals, suboccipitals              CERVICAL ROM:    Active ROM A/PROM (deg) eval 02/25/22  Flexion 70   Extension 30   Right lateral flexion 30   Left lateral flexion 25   Right rotation 55 55  Left rotation 40 60    UPPER EXTREMITY MMT:   MMT Right eval Left eval Rt / Lt 03/31/2022  Shoulder flexion 5 5   Shoulder extension 5 5  Shoulder abduction 5 5   Shoulder internal rotation 5 5   Shoulder external rotation 5 5   Middle trapezius 4- 4- 4- / 4-  Lower trapezius 4- 4- 4- / 4-    CERVICAL SPECIAL TESTS:  Radicular testing grossly negative, patient does report muscular tightness at end range   FUNCTIONAL TESTS:  DNF endurance: 12 seconds     TODAY'S TREATMENT:  [facility] Adult PT Treatment:                                                DATE: 03/31/22 Therapeutic Exercise: UBE L3 x 4 min (2 fwd/bwd) while taking subjective Supine cervical retractions 10 x 5 sec Supine horizontal abduction with red x 20 Sidelying thoracic rotation x 10 each Supine thoracic extension mobs over FR Cat cow x 10 Child's pose stretch with physioball 5 x 10 sec Corner pec stretch 3 x 20 sec Doubler ER and scap retraction with red x  20 Row with 10# FM 2 x 15 Manual Therapy: Suboccipital release with gentle manual traction Passive neck stretching   [facility] Adult PT Treatment:                                                DATE: 03/17/22 Therapeutic Exercise: UBE L3 x 4 min (2 fwd/bwd) while taking subjective Seated upper trap stretch 2 x 30 sec each Supine thoracic extension mobs over FR Sidelying thoracic rotation x 10 each Supine cervical retractions 10 x 5 sec Cat cow x 10 Corner pec stretch 3 x 20 sec Row with yellow elastisprings x 20 Manual Therapy: Skilled palpation and monitoring of muscle tension while performing TPDN STM bilateral cervical paraspinals, upper traps, levator scapulae Suboccipital release with gentle manual traction Trigger Point Dry Needling Treatment: Pre-treatment instruction: Patient instructed on dry needling rationale, procedures, and possible side effects including pain during treatment (achy,cramping feeling), bruising, drop of blood, lightheadedness, nausea, sweating. Patient Consent Given: Yes Education handout provided: Previously provided Muscles treated: Bilateral upper trap, bilateral suboccipitals, right splenius cervicis  Needle size and number: .30x50mm x 5 Treatment response/outcome: Twitch response elicited and Palpable decrease in muscle tension Post-treatment instructions: Patient instructed to expect possible mild to moderate muscle soreness later today and/or tomorrow. Patient instructed in methods to reduce muscle soreness and to continue prescribed HEP. If patient was dry needled over the lung field, patient was instructed on signs and symptoms of pneumothorax and, however unlikely, to see immediate medical attention should they occur. Patient was also educated on signs and symptoms of infection and to seek medical attention should they occur. Patient verbalized understanding of these instructions and education.  [facility] Adult PT Treatment:                                                 DATE: 03/10/22 Therapeutic Exercise: UBE L3 x 4 min (2 fwd/bwd) while taking subjective Seated upper trap stretch 2 x 30 sec each Supine cervical retractions 10 x 5 sec Sidelying thoracic rotation x 10 each Seated thoracic extension x 10  Corner pec stretch 3 x   20 sec Manual Therapy: Skilled palpation and monitoring of muscle tension while performing TPDN STM bilateral cervical paraspinals, upper traps, levator scapulae Suboccipital release with gentle manual traction Trigger Point Dry Needling Treatment: Pre-treatment instruction: Patient instructed on dry needling rationale, procedures, and possible side effects including pain during treatment (achy,cramping feeling), bruising, drop of blood, lightheadedness, nausea, sweating. Patient Consent Given: Yes Education handout provided: Previously provided Muscles treated: Bilateral upper trap, bilateral suboccipitals, right splenius cervicis  Needle size and number: .30x50mm x 5 Treatment response/outcome: Twitch response elicited and Palpable decrease in muscle tension Post-treatment instructions: Patient instructed to expect possible mild to moderate muscle soreness later today and/or tomorrow. Patient instructed in methods to reduce muscle soreness and to continue prescribed HEP. If patient was dry needled over the lung field, patient was instructed on signs and symptoms of pneumothorax and, however unlikely, to see immediate medical attention should they occur. Patient was also educated on signs and symptoms of infection and to seek medical attention should they occur. Patient verbalized understanding of these instructions and education.   PATIENT EDUCATION:  Education details: HEP update Person educated: Patient Education method: Explanation, Demonstration, Tactile cues, Verbal cues, Handout Education comprehension: verbalized understanding, returned demonstration, verbal cues required, tactile cues required, and needs further  education   HOME EXERCISE PROGRAM: Access Code: [email or web address]     ASSESSMENT: CLINICAL IMPRESSION: Patient tolerated therapy well with no adverse effects. Therapy focused primarily on therex to progress spinal mobility and postural strength/control with good tolerance. She continues to demonstrate gross postural strength deficits with postural deviations. She does report continue improvement in symptoms. Cueing required for posture and avoid shrug with exercises. Updated HEP to progress postural mobility with good tolerance. Patient would benefit from continued skilled PT to progress her mobility and strength in order to reduce pain and maximize functional ability.     OBJECTIVE IMPAIRMENTS decreased activity tolerance, decreased endurance, decreased ROM, decreased strength, impaired flexibility, postural dysfunction, and pain.    ACTIVITY LIMITATIONS carrying, lifting, sitting, and hygiene/grooming   PARTICIPATION LIMITATIONS: meal prep, cleaning, driving, and occupation   PERSONAL FACTORS Past/current experiences and Time since onset of injury/illness/exacerbation are also affecting patient's functional outcome.      GOALS: Goals reviewed with patient? Yes   SHORT TERM GOALS: Target date: 03/13/2022    Patient will be I with initial HEP in order to progress with therapy. Baseline: HEP provided at eval 03/03/2022: independent Goal status: MET   2.  PT will review FOTO with patient by 3rd visit in order to understand expected progress and outcome with therapy. Baseline: FOTO assessed at eval 03/03/2022: reviewed Goal status: MET   3.  Patient will report pain level </= 4/10 in order to reduce functional limitations Baseline: 7/10 03/03/2022: 5/10 pain level Goal status: PARTIALLY MET   LONG TERM GOALS: Target date: 04/10/2022   Patient will be I with final HEP to maintain progress from PT. Baseline: HEP provided at eval Goal status: INITIAL   2.  Patient will report >/= 64%  status on FOTO to indicate improved functional ability. Baseline: 53% functional status 03/10/2022: 58% Goal status: PARTIALLY MET   3.  Patient will demonstrate left cervical rotation AROM >/= 60 deg in order to improve driving ability. Baseline: 40 deg Goal status: INITIAL   4.  Patient will demonstrate DNF endurance >/= 30 seconds in order to improve postural control and reduce neck pain. Baseline: 12 seconds Goal status: INITIAL     PLAN: PT FREQUENCY: 1x/week     PT DURATION: 8 weeks   PLANNED INTERVENTIONS: Therapeutic exercises, Therapeutic activity, Neuromuscular re-education, Balance training, Gait training, Patient/Family education, Self Care, Joint mobilization, Joint manipulation, Aquatic Therapy, Dry Needling, Electrical stimulation, Spinal manipulation, Spinal mobilization, Cryotherapy, Moist heat, Taping, Manual therapy, and Re-evaluation   PLAN FOR NEXT SESSION: Review HEP and progress PRN, manual/dry needling for upper trap and neck region, cervical and thoracic mobility, progress postural control   Hilda Blades, PT, DPT, LAT, ATC 03/31/22  5:07 PM Phone: [phone] Fax: [phone]

## 2022-04-02 ENCOUNTER — Encounter: Payer: Self-pay | Admitting: Physician Assistant

## 2022-04-02 ENCOUNTER — Other Ambulatory Visit (INDEPENDENT_AMBULATORY_CARE_PROVIDER_SITE_OTHER): Payer: BC Managed Care – PPO

## 2022-04-02 ENCOUNTER — Ambulatory Visit (INDEPENDENT_AMBULATORY_CARE_PROVIDER_SITE_OTHER): Payer: BC Managed Care – PPO | Admitting: Physician Assistant

## 2022-04-02 VITALS — BP 120/88 | HR 60 | Ht 63.0 in | Wt 161.0 lb

## 2022-04-02 DIAGNOSIS — K625 Hemorrhage of anus and rectum: Secondary | ICD-10-CM | POA: Diagnosis not present

## 2022-04-02 DIAGNOSIS — E538 Deficiency of other specified B group vitamins: Secondary | ICD-10-CM

## 2022-04-02 DIAGNOSIS — J3081 Allergic rhinitis due to animal (cat) (dog) hair and dander: Secondary | ICD-10-CM | POA: Diagnosis not present

## 2022-04-02 DIAGNOSIS — Z86010 Personal history of colonic polyps: Secondary | ICD-10-CM

## 2022-04-02 DIAGNOSIS — J3089 Other allergic rhinitis: Secondary | ICD-10-CM | POA: Diagnosis not present

## 2022-04-02 DIAGNOSIS — R790 Abnormal level of blood mineral: Secondary | ICD-10-CM | POA: Diagnosis not present

## 2022-04-02 DIAGNOSIS — R1319 Other dysphagia: Secondary | ICD-10-CM | POA: Diagnosis not present

## 2022-04-02 DIAGNOSIS — J301 Allergic rhinitis due to pollen: Secondary | ICD-10-CM | POA: Diagnosis not present

## 2022-04-02 LAB — VITAMIN B12: Vitamin B-12: 709 pg/mL (ref 211–911)

## 2022-04-02 LAB — IBC + FERRITIN
Ferritin: 25.7 ng/mL (ref 10.0–291.0)
Iron: 109 ug/dL (ref 42–145)
Saturation Ratios: 31.8 % (ref 20.0–50.0)
TIBC: 343 ug/dL (ref 250.0–450.0)
Transferrin: 245 mg/dL (ref 212.0–360.0)

## 2022-04-02 MED ORDER — NA SULFATE-K SULFATE-MG SULF 17.5-3.13-1.6 GM/177ML PO SOLN: 1.0000 | Freq: Once | ORAL | 0 refills | Status: AC

## 2022-04-02 NOTE — Patient Instructions (Addendum)
_______________________________________________________  If you are age 65 or older, your body mass index should be between 23-30. Your Body mass index is 28.52 kg/m. If this is out of the aforementioned range listed, please consider follow up with your Primary Care Provider.  If you are age 60 or younger, your body mass index should be between 19-25. Your Body mass index is 28.52 kg/m. If this is out of the aformentioned range listed, please consider follow up with your Primary Care Provider.   ________________________________________________________  The [facility] providers would like to encourage you to use [facility] to communicate with providers for non-urgent requests or questions.  Due to long hold times on the telephone, sending your provider a message by [facility] may be a faster and more efficient way to get a response.  Please allow 48 business hours for a response.  Please remember that this is for non-urgent requests.  _______________________________________________________  Faith [name] have been scheduled for an endoscopy and colonoscopy. Please follow the written instructions given to you at your visit today. Please pick up your prep supplies at the pharmacy within the next 1-3 days. If you use inhalers (even only as needed), please bring them with you on the day of your procedure.   Your provider has requested that you go to the basement level for lab work before leaving today. Press "B" on the elevator. The lab is located at the first door on the left as you exit the elevator.   Due to recent changes in healthcare laws, you may see the results of your imaging and laboratory studies on MyChart before your provider has had a chance to review them.  We understand that in some cases there may be results that are confusing or concerning to you. Not all laboratory results come back in the same time frame and the provider may be waiting for multiple results in order to interpret others.  Please  give [place] 48 hours in order for your provider to thoroughly review all the results before contacting the office for clarification of your results.    It was a pleasure to see you today!  Thank you for trusting me with your gastrointestinal care!

## 2022-04-06 ENCOUNTER — Other Ambulatory Visit: Payer: Self-pay

## 2022-04-06 ENCOUNTER — Ambulatory Visit: Payer: BC Managed Care – PPO | Admitting: Nurse Practitioner

## 2022-04-06 ENCOUNTER — Encounter: Payer: Self-pay | Admitting: Physical Therapy

## 2022-04-06 ENCOUNTER — Ambulatory Visit: Payer: BC Managed Care – PPO | Admitting: Physical Therapy

## 2022-04-06 DIAGNOSIS — R293 Abnormal posture: Secondary | ICD-10-CM

## 2022-04-06 DIAGNOSIS — M6281 Muscle weakness (generalized): Secondary | ICD-10-CM | POA: Diagnosis not present

## 2022-04-06 DIAGNOSIS — M542 Cervicalgia: Secondary | ICD-10-CM | POA: Diagnosis not present

## 2022-04-06 NOTE — Therapy (Signed)
OUTPATIENT PHYSICAL THERAPY TREATMENT NOTE   Patient Name: Faith [name] MRN: [id number] DOB:08-08-1955, 66 y.o., female Today's Date: 04/06/2022  PCP: Michael Boston, MD REFERRING PROVIDER: Marcial Pacas, MD  END OF SESSION:   PT End of Session - 04/06/22 1559     Visit Number 8    Number of Visits 9    Date for PT Re-Evaluation 04/10/22    Authorization Type BCBS    PT Start Time 1533    PT Stop Time 1616    PT Time Calculation (min) 43 min    Activity Tolerance Patient tolerated treatment well    Behavior During Therapy WFL for tasks assessed/performed                   Past Medical History:  Diagnosis Date   Allergy    spring    Anemia    yrs ago    Arthritis    Family history of colon cancer in father    Gastric ulcer    GERD (gastroesophageal reflux disease)    Hypertension    MVP (mitral valve prolapse)    Nerve damage 2023   Osteopenia    Past Surgical History:  Procedure Laterality Date   BREAST EXCISIONAL BIOPSY Right 1986   BREAST REDUCTION SURGERY     CHOLECYSTECTOMY     COLONOSCOPY     GASTRIC BYPASS     KNEE ARTHROSCOPY     right   REDUCTION MAMMAPLASTY Bilateral 2001   SKIN CANCER EXCISION     TEMPOROMANDIBULAR JOINT SURGERY     TONSILLECTOMY AND ADENOIDECTOMY     TUBAL LIGATION     UPPER GASTROINTESTINAL ENDOSCOPY     Patient Active Problem List   Diagnosis Date Noted   Neck pain 01/29/2022   Snores 01/29/2022   Coronary artery calcification 10/10/2019   Hyperlipidemia 66/10/3014   Periumbilical pain 05/26/3233   Generalized abdominal pain 09/05/2019   Elevated lipase 09/05/2019   Chest pain of uncertain etiology 57/32/2025   Osteoarthritis of right knee 01/14/2016   Memory loss 08/22/2014   Hyperreflexia 08/22/2014   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  PAIN:  Are you having pain? Yes:  NPRS scale: 4/10 (7/10 pain at worse) Pain location: Neck (base of scull) and bilateral shoulders (L > R) Pain description: Tightness, soreness, "nails" Aggravating factors: Unknown Relieving factors: Medication   OBJECTIVE: (objective measures completed at initial evaluation unless otherwise dated) PATIENT SURVEYS:  FOTO 53% functional status  03/10/2022: 58%   POSTURE:  Rounded shoulder and forward head posture   PALPATION: Tender to palpation bilateral upper trap, cervical paraspinals, suboccipitals              CERVICAL ROM:    Active ROM A/PROM (deg) eval 02/25/22 04/06/2022  Flexion 70    Extension 30  35  Right lateral flexion 30  30  Left lateral flexion 25  30  Right rotation 55 55 55  Left rotation 40 60 60    UPPER EXTREMITY MMT:   MMT Right eval Left eval Rt / Lt 03/31/2022  Shoulder flexion  5 5   Shoulder extension 5 5   Shoulder abduction 5 5   Shoulder internal rotation 5 5   Shoulder external rotation 5 5   Middle trapezius 4- 4- 4- / 4-  Lower trapezius 4- 4- 4- / 4-    CERVICAL SPECIAL TESTS:  Radicular testing grossly negative, patient does report muscular tightness at end range   FUNCTIONAL TESTS:  DNF endurance: 12 seconds     TODAY'S TREATMENT:  [facility] Adult PT Treatment:                                                DATE: 04/06/22 Therapeutic Exercise: UBE L3 x 4 min (2 fwd/bwd) while taking subjective Seated upper trap stretch 2 x 20 sec each Corner pec stretch 2 x 30 sec Sidelying thoracic rotation x 10 each Supine thoracic extension mobs over FR Cat cow x 10 Seated double ER and scap retraction with red 2 x 20 Seated horizontal abduction  with red 2 x 15 Row with blue 2 x 20 Extension with green 2 x 15 Manual Therapy: Skilled palpation and monitoring of muscle tension while performing TPDN STM bilateral upper traps Trigger Point Dry Needling Treatment: Pre-treatment instruction: Patient instructed on dry needling rationale, procedures, and possible side effects including pain during treatment (achy,cramping feeling), bruising, drop of blood, lightheadedness, nausea, sweating. Patient Consent Given: Yes Education handout provided: Previously provided Muscles treated: Bilateral upper trap  Needle size and number: .30x54m x 4 Treatment response/outcome: Twitch response elicited and Palpable decrease in muscle tension Post-treatment instructions: Patient instructed to expect possible mild to moderate muscle soreness later today and/or tomorrow. Patient instructed in methods to reduce muscle soreness and to continue prescribed HEP. If patient was dry needled over the lung field, patient was instructed on signs and symptoms of pneumothorax and, however unlikely, to see immediate medical attention should they occur. Patient was also educated on signs and symptoms of infection and to seek medical attention should they occur. Patient verbalized understanding of these instructions and education.   [facility]                                                [facility]: [facility] Therapeutic Exercise: UBE L3 x 4 min (2 fwd/bwd) while taking subjective Supine cervical retractions 10 x 5 sec Supine horizontal abduction with red x 20 Sidelying thoracic rotation x 10 each Supine thoracic extension mobs over FR Cat cow x 10 Child's pose stretch with physioball 5 x 10 sec Corner pec stretch 3 x 20 sec Doubler ER and scap retraction with red x 20 Row with 10# FM 2 x 15 Manual Therapy: Suboccipital release with gentle manual traction Passive neck stretching  [facility] Adult PT Treatment:                                                 DATE: 03/17/22 Therapeutic Exercise: UBE L3 x 4 min (2 fwd/bwd) while taking subjective Seated upper trap stretch 2 x 30 sec each Supine thoracic extension mobs over FR Sidelying thoracic rotation x 10  each Supine cervical retractions 10 x 5 sec Cat cow x 10 Corner pec stretch 3 x 20 sec Row with yellow elastisprings x 20 Manual Therapy: Skilled palpation and monitoring of muscle tension while performing TPDN STM bilateral cervical paraspinals, upper traps, levator scapulae Suboccipital release with gentle manual traction Trigger Point Dry Needling Treatment: Pre-treatment instruction: Patient instructed on dry needling rationale, procedures, and possible side effects including pain during treatment (achy,cramping feeling), bruising, drop of blood, lightheadedness, nausea, sweating. Patient Consent Given: Yes Education handout provided: Previously provided Muscles treated: Bilateral upper trap, bilateral suboccipitals, right splenius cervicis  Needle size and number: .30x60m x 5 Treatment response/outcome: Twitch response elicited and Palpable decrease in muscle tension Post-treatment instructions: Patient instructed to expect possible mild to moderate muscle soreness later today and/or tomorrow. Patient instructed in methods to reduce muscle soreness and to continue prescribed HEP. If patient was dry needled over the lung field, patient was instructed on signs and symptoms of pneumothorax and, however unlikely, to see immediate medical attention should they occur. Patient was also educated on signs and symptoms of infection and to seek medical attention should they occur. Patient verbalized understanding of these instructions and education.   PATIENT EDUCATION:  Education details: HEP Person educated: Patient Education method: EEducation officer, environmental TCorporate treasurercues, Verbal cues Education comprehension: verbalized understanding, returned demonstration, verbal cues required, tactile cues  required, and needs further education   HOME EXERCISE PROGRAM: Access Code: [email or web address]    ASSESSMENT: CLINICAL IMPRESSION: Patient tolerated therapy well with no adverse effects. She arrives reporting increased tightness for neck and shoulders so continued with TPDN as she reports continued benefit. Therapy continues to focus primarily on progression of neck and thoracic mobility and postural control. Continued with parascapular strengthening with good tolerance she did require cueing to avoid shrug with banded exercises. She did demonstrate improved cervical range of motion following treatment this visit. No changes to her HEP this visit. Patient would benefit from continued skilled PT to progress her mobility and strength in order to reduce pain and maximize functional ability.     OBJECTIVE IMPAIRMENTS decreased activity tolerance, decreased endurance, decreased ROM, decreased strength, impaired flexibility, postural dysfunction, and pain.    ACTIVITY LIMITATIONS carrying, lifting, sitting, and hygiene/grooming   PARTICIPATION LIMITATIONS: meal prep, cleaning, driving, and occupation   PERSONAL FACTORS Past/current experiences and Time since onset of injury/illness/exacerbation are also affecting patient's functional outcome.      GOALS: Goals reviewed with patient? Yes   SHORT TERM GOALS: Target date: 03/13/2022    Patient will be I with initial HEP in order to progress with therapy. Baseline: HEP provided at eval 03/03/2022: independent Goal status: MET   2.  PT will review FOTO with patient by 3rd visit in order to understand expected progress and outcome with therapy. Baseline: FOTO assessed at eval 03/03/2022: reviewed Goal status: MET   3.  Patient will report pain level </= 4/10 in order to reduce functional limitations Baseline: 7/10 03/03/2022: 5/10 pain level Goal status: PARTIALLY MET   LONG TERM GOALS: Target date: 04/10/2022   Patient will be I with final HEP  to maintain progress from PT. Baseline: HEP provided at eval Goal status: INITIAL   2.  Patient will report >/= 64% status on FOTO to indicate improved functional ability. Baseline: 53% functional status 03/10/2022: 58% Goal status: PARTIALLY MET   3.  Patient will demonstrate left cervical rotation AROM >/= 60 deg in order to improve driving ability. Baseline: 40 deg Goal  status: INITIAL   4.  Patient will demonstrate DNF endurance >/= 30 seconds in order to improve postural control and reduce neck pain. Baseline: 12 seconds Goal status: INITIAL     PLAN: PT FREQUENCY: 1x/week   PT DURATION: 8 weeks   PLANNED INTERVENTIONS: Therapeutic exercises, Therapeutic activity, Neuromuscular re-education, Balance training, Gait training, Patient/Family education, Self Care, Joint mobilization, Joint manipulation, Aquatic Therapy, Dry Needling, Electrical stimulation, Spinal manipulation, Spinal mobilization, Cryotherapy, Moist heat, Taping, Manual therapy, and Re-evaluation   PLAN FOR NEXT SESSION: Review HEP and progress PRN, manual/dry needling for upper trap and neck region, cervical and thoracic mobility, progress postural control   Hilda Blades, PT, DPT, LAT, ATC 04/06/22  5:09 PM Phone: [phone] Fax: [phone]

## 2022-04-07 ENCOUNTER — Encounter: Payer: BC Managed Care – PPO | Admitting: Physical Therapy

## 2022-04-13 ENCOUNTER — Ambulatory Visit: Payer: BC Managed Care – PPO | Admitting: Physical Therapy

## 2022-04-13 ENCOUNTER — Other Ambulatory Visit: Payer: Self-pay

## 2022-04-13 ENCOUNTER — Encounter: Payer: Self-pay | Admitting: Physical Therapy

## 2022-04-13 DIAGNOSIS — R293 Abnormal posture: Secondary | ICD-10-CM

## 2022-04-13 DIAGNOSIS — M542 Cervicalgia: Secondary | ICD-10-CM | POA: Diagnosis not present

## 2022-04-13 DIAGNOSIS — M6281 Muscle weakness (generalized): Secondary | ICD-10-CM | POA: Diagnosis not present

## 2022-04-13 NOTE — Therapy (Signed)
OUTPATIENT PHYSICAL THERAPY TREATMENT NOTE   Patient Name: Faith [name] MRN: [id number] DOB:20-Aug-1955, 66 y.o., female Today's Date: 04/13/2022  PCP: Michael Boston, MD REFERRING PROVIDER: Marcial Pacas, MD  END OF SESSION:   PT End of Session - 04/13/22 1413     Visit Number 9    Number of Visits 13    Date for PT Re-Evaluation 05/11/22    Authorization Type BCBS    PT Start Time 1407    PT Stop Time 5916    PT Time Calculation (min) 38 min    Activity Tolerance Patient tolerated treatment well    Behavior During Therapy WFL for tasks assessed/performed                    Past Medical History:  Diagnosis Date   Allergy    spring    Anemia    yrs ago    Arthritis    Family history of colon cancer in father    Gastric ulcer    GERD (gastroesophageal reflux disease)    Hypertension    MVP (mitral valve prolapse)    Nerve damage 2023   Osteopenia    Past Surgical History:  Procedure Laterality Date   BREAST EXCISIONAL BIOPSY Right 1986   BREAST REDUCTION SURGERY     CHOLECYSTECTOMY     COLONOSCOPY     GASTRIC BYPASS     KNEE ARTHROSCOPY     right   REDUCTION MAMMAPLASTY Bilateral 2001   SKIN CANCER EXCISION     TEMPOROMANDIBULAR JOINT SURGERY     TONSILLECTOMY AND ADENOIDECTOMY     TUBAL LIGATION     UPPER GASTROINTESTINAL ENDOSCOPY     Patient Active Problem List   Diagnosis Date Noted   Neck pain 01/29/2022   Snores 01/29/2022   Coronary artery calcification 10/10/2019   Hyperlipidemia 38/46/6599   Periumbilical pain 35/70/1779   Generalized abdominal pain 09/05/2019   Elevated lipase 09/05/2019   Chest pain of uncertain etiology 39/07/90   Osteoarthritis of right knee 01/14/2016   Memory loss 08/22/2014   Hyperreflexia 08/22/2014   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 She is a little sore and tired from the holidays.  PAIN:  Are you having pain? Yes:  NPRS scale: 4/10 (7/10 pain at worse) Pain location: Neck (base of scull) and bilateral shoulders (L > R) Pain description: Tightness, soreness, "nails" Aggravating factors: Unknown Relieving factors: Medication   OBJECTIVE: (objective measures completed at initial evaluation unless otherwise dated) PATIENT SURVEYS:  FOTO 53% functional status  03/10/2022: 58% 04/13/2022: 51%   POSTURE:  Rounded shoulder and forward head posture   PALPATION: Tender to palpation bilateral upper trap, cervical paraspinals, suboccipitals              CERVICAL ROM:    Active ROM A/PROM (deg) eval 02/25/22 04/06/2022 04/13/2022  Flexion 70     Extension 30  35 35  Right lateral flexion _0 Left lateral flexion _1 Right rotation 55 55 55 55  Left rotation 40 60 60 60    UPPER EXTREMITY MMT:   MMT Right eval  Left eval Rt / Lt 03/31/2022 Rt / Lt 04/13/2022  Shoulder flexion 5 5    Shoulder extension 5 5    Shoulder abduction 5 5    Shoulder internal rotation 5 5    Shoulder external rotation 5 5    Middle trapezius 4- 4- 4- / 4- 4- / 4-  Lower trapezius 4- 4- 4- / 4- 4- / 4-    CERVICAL SPECIAL TESTS:  Radicular testing grossly negative, patient does report muscular tightness at end range   FUNCTIONAL TESTS:  DNF endurance: 12 seconds  04/13/2022: 15 seconds     TODAY'S TREATMENT:  [facility] Adult PT Treatment:                                                DATE: 04/13/22 Therapeutic Exercise: UBE L3 x 4 min (2 fwd/bwd) while taking subjective Sidelying thoracic rotation x 10 each Supine thoracic extension mobs over FR Corner pec stretch 3 x 30 sec Supine chin tuck 10 x 5 sec Supine chin tuck with  lift 2 x 10 Supine horizontal abduction with green 2 x 15 Prone I 10 x 5 sec Prone T 10 x 5 sec Child's pose stretch with physioball 5 x 10 sec Seated double ER and scap retraction with green 2 x 20 Row with blue 2 x 20   [facility] Adult PT Treatment:                                                DATE: 04/06/22 Therapeutic Exercise: UBE L3 x 4 min (2 fwd/bwd) while taking subjective Seated upper trap stretch 2 x 20 sec each Corner pec stretch 2 x 30 sec Sidelying thoracic rotation x 10 each Supine thoracic extension mobs over FR Cat cow x 10 Seated double ER and scap retraction with red 2 x 20 Seated horizontal abduction with red 2 x 15 Row with blue 2 x 20 Extension with green 2 x 15 Manual Therapy: Skilled palpation and monitoring of muscle tension while performing TPDN STM bilateral upper traps Trigger Point Dry Needling Treatment: Pre-treatment instruction: Patient instructed on dry needling rationale, procedures, and possible side effects including pain during treatment (achy,cramping feeling), bruising, drop of blood, lightheadedness, nausea, sweating. Patient Consent Given: Yes Education handout provided: Previously provided Muscles treated: Bilateral upper trap  Needle size and number: .30x16m x 4 Treatment response/outcome: Twitch response elicited and Palpable decrease in muscle tension Post-treatment instructions: Patient instructed to expect possible mild to moderate muscle soreness later today and/or tomorrow. Patient instructed in methods to reduce muscle soreness and to continue prescribed HEP. If patient was dry needled over the lung field, patient was instructed on signs and symptoms of pneumothorax and, however unlikely, to see immediate medical attention should they occur. Patient was also educated on signs and symptoms of infection and to seek medical attention should they occur. Patient verbalized understanding of these instructions and education.  [facility]  [facility]: 03[facility]22 Therapeutic Exercise: UBE L3 x 4 min (2 fwd/bwd) while taking subjective Supine cervical retractions 10 x 5 sec Supine horizontal abduction with red x 20 Sidelying thoracic rotation x 10 each Supine thoracic extension mobs over FR Cat cow x 10 Child's pose stretch with physioball 5 x 10 sec Corner pec stretch 3 x 20 sec Doubler ER and scap retraction with red x 20 Row with 10# FM 2 x 15 Manual Therapy: Suboccipital release with gentle manual traction Passive neck stretching   PATIENT EDUCATION:  Education details: POC update, FOTO, HEP Person educated: Patient Education method: Explanation, Demonstration, Tactile cues, Verbal cues, Handout Education comprehension: verbalized understanding, returned demonstration, verbal cues required, tactile cues required, and needs further education   HOME EXERCISE PROGRAM: Access Code: [email or web address]     ASSESSMENT: CLINICAL IMPRESSION: Patient tolerated therapy well with no adverse effects. Overall patient subjectively reports improvement and demonstrates improved cervical motion, but she does report a regression in function on FOTO compared to a previous assessment. Her strength and postural control has remained relatively the same since evaluation. Therapy continues to focus primarily on improve spinal mobility and postural strength and endurance this visit. Updated her HEP to progress postural control. Patient would benefit from continued skilled PT to progress her mobility and strength in order to reduce pain and maximize functional ability, so will extend PT POC for 4 more weeks at frequency of 1x/week.    OBJECTIVE IMPAIRMENTS decreased activity tolerance, decreased endurance, decreased ROM, decreased strength, impaired flexibility, postural dysfunction, and pain.    ACTIVITY LIMITATIONS carrying, lifting, sitting, and hygiene/grooming   PARTICIPATION LIMITATIONS: meal  prep, cleaning, driving, and occupation   PERSONAL FACTORS Past/current experiences and Time since onset of injury/illness/exacerbation are also affecting patient's functional outcome.      GOALS: Goals reviewed with patient? Yes   SHORT TERM GOALS: Target date: 03/13/2022    Patient will be I with initial HEP in order to progress with therapy. Baseline: HEP provided at eval 03/03/2022: independent Goal status: MET   2.  PT will review FOTO with patient by 3rd visit in order to understand expected progress and outcome with therapy. Baseline: FOTO assessed at eval 03/03/2022: reviewed Goal status: MET   3.  Patient will report pain level </= 4/10 in order to reduce functional limitations Baseline: 7/10 03/03/2022: 5/10 pain level 04/13/2022: 5/10 Goal status: PARTIALLY MET   LONG TERM GOALS: Target date: 05/11/2022   Patient will be I with final HEP to maintain progress from PT. Baseline: HEP provided at eval 04/13/2022: progressing Goal status: ONGOING   2.  Patient will report >/= 64% status on FOTO to indicate improved functional ability. Baseline: 53% functional status 03/10/2022: 58% 04/13/2022: 51% Goal status: PARTIALLY MET   3.  Patient will demonstrate left cervical rotation AROM >/= 60 deg in order to improve driving ability. Baseline: 40 deg 04/13/2022: 60 deg Goal status: MET   4.  Patient will demonstrate DNF endurance >/= 30 seconds in order to improve postural control and reduce neck pain. Baseline: 12 seconds 04/13/2022: 15 seconds Goal status: PARTIALLY MET     PLAN: PT FREQUENCY: 1x/week   PT DURATION: 4 weeks   PLANNED INTERVENTIONS: Therapeutic exercises, Therapeutic activity, Neuromuscular re-education, Balance training, Gait training, Patient/Family education, Self Care, Joint mobilization, Joint manipulation, Aquatic Therapy, Dry Needling, Electrical stimulation, Spinal manipulation, Spinal mobilization, Cryotherapy, Moist heat, Taping,  Manual therapy, and Re-evaluation   PLAN FOR NEXT SESSION: Review HEP and progress PRN, manual/dry needling  for upper trap and neck region, cervical and thoracic mobility, progress postural control   Hilda Blades, PT, DPT, LAT, ATC 04/13/22  4:25 PM Phone: [phone] Fax: [phone]

## 2022-04-13 NOTE — Patient Instructions (Signed)
Access Code: [email or web address] URL: [email or web address] Date: 04/13/2022 Prepared by: Hilda Blades  Exercises - Supine Cervical Retraction with Towel  - 1 x daily - 2 sets - 10 reps - 5 seconds hold - Supine Deep Neck Flexor Training - Repetitions  - 1 x daily - 2 sets - 10 reps - Sidelying Thoracic Lumbar Rotation  - 1 x daily - 2 sets - 10 reps - 5 seconds hold - Prone Scapular Slide with Shoulder Extension  - 1 x daily - 2 sets - 10 reps - Prone T  - 1 x daily - 2 sets - 10 reps - Kneeling Thoracic Extension Stretch with Swiss Ball  - 1 x daily - 2 sets - 10 reps - 5 seconds hold - Seated Thoracic Lumbar Extension with Pectoralis Stretch  - 1 x daily - 2 sets - 10 reps - 3 sec  hold - Corner Pec Major Stretch  - 1 x daily - 3 sets - 30 seconds hold - Standing Row with Anchored Resistance  - 1 x daily - 3 sets - 20 reps - Shoulder External Rotation and Scapular Retraction with Resistance  - 1 x daily - 3 sets - 15 reps

## 2022-04-14 ENCOUNTER — Ambulatory Visit: Payer: BC Managed Care – PPO | Admitting: Neurology

## 2022-04-14 DIAGNOSIS — R0683 Snoring: Secondary | ICD-10-CM

## 2022-04-14 DIAGNOSIS — G4733 Obstructive sleep apnea (adult) (pediatric): Secondary | ICD-10-CM | POA: Diagnosis not present

## 2022-04-14 DIAGNOSIS — R292 Abnormal reflex: Secondary | ICD-10-CM

## 2022-04-15 DIAGNOSIS — J301 Allergic rhinitis due to pollen: Secondary | ICD-10-CM | POA: Diagnosis not present

## 2022-04-15 DIAGNOSIS — J3089 Other allergic rhinitis: Secondary | ICD-10-CM | POA: Diagnosis not present

## 2022-04-15 DIAGNOSIS — J3081 Allergic rhinitis due to animal (cat) (dog) hair and dander: Secondary | ICD-10-CM | POA: Diagnosis not present

## 2022-04-16 NOTE — Progress Notes (Signed)
See procedure note.

## 2022-04-19 NOTE — Procedures (Signed)
   [facility]  HOME SLEEP TEST (Watch PAT) REPORT  STUDY DATE: 04/15/22  DOB: [birth year]  MRN: [id number]  ORDERING CLINICIAN: Star Age, MD, PhD   REFERRING CLINICIAN: Dr. Brett Fairy (Sleep), Dr. Krista Blue (Neuro)  CLINICAL INFORMATION/HISTORY: 66 yo female with a history of Allergy, Anemia, Arthritis, Family history of colon cancer in father, Gastric ulcer, GERD (gastroesophageal reflux disease), Hypertension, MVP (mitral valve prolapse), and Osteopenia, who reports difficulty sleeping and nocturia.  Epworth sleepiness score: 3/24.  BMI: 28.5 kg/m  FINDINGS:   Sleep Summary:   Total Recording Time (hours, min): 9 hours, 28 min  Total Sleep Time (hours, min):  8 hours, 9 min  Percent REM (%):    13.7%   Respiratory Indices:   Calculated pAHI (per hour):  13/hour         REM pAHI:    28.9/hour       NREM pAHI: 10.4/hour  Central pAHI: 0.1/hour  Oxygen Saturation Statistics:    Oxygen Saturation (%) Mean: 95%   Minimum oxygen saturation (%):                 87%   O2 Saturation Range (%): 87 - 99%    O2 Saturation (minutes) <=88%: 0 min  Pulse Rate Statistics:   Pulse Mean (bpm):    51/min    Pulse Range (44 - 69/min)   IMPRESSION: OSA (obstructive sleep apnea), mild  RECOMMENDATION:  This home sleep test demonstrates overall mild obstructive sleep apnea with a total AHI of 13/hour and O2 nadir of 87%. Snoring was detected, intermittent, in the mild to moderate range. Given the patient's medical history and sleep related complaints, therapy with a  positive airway pressure device is a reasonable first-line choice and will be offered to the patient. Treatment can be achieved in the form of autoPAP trial/titration at home for now. A full night, in-lab PAP titration study may aid in improving proper treatment settings and with mask fit, if needed, down the road. Alternative treatments may include weight loss (where appropriate) along with  avoidance of the supine sleep position (if possible), or an oral appliance in appropriate candidates.   Please note that untreated obstructive sleep apnea may carry additional perioperative morbidity. Patients with significant obstructive sleep apnea should receive perioperative PAP therapy and the surgeons and particularly the anesthesiologist should be informed of the diagnosis and the severity of the sleep disordered breathing. The patient should be cautioned not to drive, work at heights, or operate dangerous or heavy equipment when tired or sleepy. Review and reiteration of good sleep hygiene measures should be pursued with any patient. Other causes of the patient's symptoms, including circadian rhythm disturbances, an underlying mood disorder, medication effect and/or an underlying medical problem cannot be ruled out based on this test. Clinical correlation is recommended.  The patient and her referring provider will be notified of the test results. The patient will be seen in follow up in sleep clinic at [facility], as necessary.  I certify that I have reviewed the raw data recording prior to the issuance of this report in accordance with the standards of the American Academy of Sleep Medicine (AASM).  INTERPRETING PHYSICIAN:   Star Age, MD, PhD  Board Certified in Neurology and Sleep Medicine  [facility] [address], [place], [place] [address] [phone]

## 2022-04-20 NOTE — Addendum Note (Signed)
Addended by: Star Age on: 04/20/2022 12:32 PM   Modules accepted: Orders

## 2022-04-21 ENCOUNTER — Telehealth: Payer: Self-pay | Admitting: *Deleted

## 2022-04-21 NOTE — Telephone Encounter (Signed)
-----   Message from Star Age, MD sent at 04/20/2022 12:32 PM EST ----- This patient saw Dr. Brett Fairy for sleep evaluation on 03/04/22.  I read the home sleep test from 04/14/2022 on Dr. Edwena Felty behalf:  Please call and notify the patient that the recent home sleep test showed obstructive sleep apnea. OSA is overall mild, but worth treating to see if she feels better after treatment. To that end I recommend treatment for this in the form of autoPAP, which means, that we don't have to bring her back for a second sleep study with CPAP, but will let him try an autoPAP machine at home, through a DME company (of her choice, or as per insurance requirement). The DME representative will educate her on how to use the machine, how to put the mask on, etc. I have placed an order in the chart. Please send referral, talk to patient, send report to referring MD. We will need a FU in sleep clinic for 10 weeks post-PAP set up, please arrange that with Dr. D or one of our NPs. Thanks,   Star Age, MD, PhD [facility])

## 2022-04-21 NOTE — Telephone Encounter (Signed)
I would not recommend retesting her soon but she can certainly discuss this further with Dr. Brett Fairy upon her return.

## 2022-04-21 NOTE — Telephone Encounter (Signed)
She can certainly wait until she is done with her upcoming procedures.  She can consider a dental device as an alternative to AutoPap therapy.  If she would like, she can also wait until Dr. Brett Fairy is back in January and discussed with her at that point if she would recommend AutoPap versus a dental device.

## 2022-04-21 NOTE — Telephone Encounter (Signed)
She also wanted to know: Would it be beneficial to have repeat SS after procedure to re-evaluate things?

## 2022-04-21 NOTE — Telephone Encounter (Signed)
Called pt and relayed results per Dr. Guadelupe Sabin note. Pt verbalized understanding. However, she reports she is having endoscopy, colonoscopy and esophagus stretched on 05/05/2022.  She is concerned about going on CPAP and causing worsening sleeping issues. She has pediatric size mouth/neck. For the last month, has trouble falling and staying asleep. Therefore, she has a couple questions for Dr. Rexene Alberts:  Since OSA mild, she is wondering if it would be ok for her son to set her up with dental device instead for treatment?  Would it be beneficial to have repeat SS after procedure to re-evaluate things?   Aware I will call her back tomorrow once I hear back from Dr. Rexene Alberts on recommendation.

## 2022-04-21 NOTE — Telephone Encounter (Signed)
Called pt. Relayed Dr. Guadelupe Sabin messages. Pt decided to move forward with getting set up with dental device via son. She preferred to schedule f/u with Dr. Brett Fairy for next available. That way she can discuss with her then on how things are going and treatment plan. Scheduled for 07/16/22 at 1:30pm with Dr. Brett Fairy.

## 2022-04-27 ENCOUNTER — Encounter: Payer: Self-pay | Admitting: Physical Therapy

## 2022-04-27 ENCOUNTER — Ambulatory Visit: Payer: BC Managed Care – PPO | Attending: Neurology | Admitting: Physical Therapy

## 2022-04-27 ENCOUNTER — Other Ambulatory Visit: Payer: Self-pay

## 2022-04-27 DIAGNOSIS — M6281 Muscle weakness (generalized): Secondary | ICD-10-CM | POA: Insufficient documentation

## 2022-04-27 DIAGNOSIS — R293 Abnormal posture: Secondary | ICD-10-CM | POA: Diagnosis not present

## 2022-04-27 DIAGNOSIS — M542 Cervicalgia: Secondary | ICD-10-CM | POA: Diagnosis not present

## 2022-04-27 NOTE — Therapy (Signed)
OUTPATIENT PHYSICAL THERAPY TREATMENT NOTE   Patient Name: Faith [name] MRN: [id number] DOB:Apr 27, 1956, 66 y.o., female Today's Date: 04/27/2022  PCP: Michael Boston, MD REFERRING PROVIDER: Marcial Pacas, MD   END OF SESSION:   PT End of Session - 04/27/22 1457     Visit Number 10    Number of Visits 13    Date for PT Re-Evaluation 05/11/22    Authorization Type BCBS    PT Start Time 1454    PT Stop Time 7939    PT Time Calculation (min) 36 min    Activity Tolerance Patient tolerated treatment well    Behavior During Therapy WFL for tasks assessed/performed                     Past Medical History:  Diagnosis Date   Allergy    spring    Anemia    yrs ago    Arthritis    Family history of colon cancer in father    Gastric ulcer    GERD (gastroesophageal reflux disease)    Hypertension    MVP (mitral valve prolapse)    Nerve damage 2023   Osteopenia    Past Surgical History:  Procedure Laterality Date   BREAST EXCISIONAL BIOPSY Right 1986   BREAST REDUCTION SURGERY     CHOLECYSTECTOMY     COLONOSCOPY     GASTRIC BYPASS     KNEE ARTHROSCOPY     right   REDUCTION MAMMAPLASTY Bilateral 2001   SKIN CANCER EXCISION     TEMPOROMANDIBULAR JOINT SURGERY     TONSILLECTOMY AND ADENOIDECTOMY     TUBAL LIGATION     UPPER GASTROINTESTINAL ENDOSCOPY     Patient Active Problem List   Diagnosis Date Noted   Neck pain 01/29/2022   Snores 01/29/2022   Coronary artery calcification 10/10/2019   Hyperlipidemia 03/00/9233   Periumbilical pain 00/76/2263   Generalized abdominal pain 09/05/2019   Elevated lipase 09/05/2019   Chest pain of uncertain etiology 33/54/5625   Osteoarthritis of right knee 01/14/2016   Memory loss 08/22/2014   Hyperreflexia 08/22/2014   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   PAIN:  Are you having pain? Yes:  NPRS scale: 4/10 (7/10 pain at worse) Pain location: Neck (base of scull) and bilateral shoulders (L > R) Pain description: Tightness, soreness, "nails" Aggravating factors: Unknown Relieving factors: Medication   OBJECTIVE: (objective measures completed at initial evaluation unless otherwise dated) PATIENT SURVEYS:  FOTO 53% functional status  03/10/2022: 58% 04/13/2022: 51%   POSTURE:  Rounded shoulder and forward head posture   PALPATION: Tender to palpation bilateral upper trap, cervical paraspinals, suboccipitals              CERVICAL ROM:    Active ROM A/PROM (deg) eval 02/25/22 04/06/2022 04/13/2022  Flexion 70     Extension 30  35 35  Right lateral flexion _0 Left lateral flexion _1 Right rotation 55 55 55 55  Left rotation 40 60 60 60    UPPER EXTREMITY MMT:   MMT Right eval Left eval Rt / Lt 03/31/2022  Rt / Lt 04/13/2022  Shoulder flexion 5 5    Shoulder extension 5 5    Shoulder abduction 5 5    Shoulder internal rotation 5 5    Shoulder external rotation 5 5    Middle trapezius 4- 4- 4- / 4- 4- / 4-  Lower trapezius 4- 4- 4- / 4- 4- / 4-    CERVICAL SPECIAL TESTS:  Radicular testing grossly negative, patient does report muscular tightness at end range   FUNCTIONAL TESTS:  DNF endurance: 12 seconds  04/13/2022: 15 seconds     TODAY'S TREATMENT:  [facility] Adult PT Treatment:                                                DATE: 04/27/22 Therapeutic Exercise: UBE L3 x 4 min (2 fwd/bwd) while taking subjective Corner pec stretch 3 x 30 sec Sidelying thoracic rotation x 10 each Supine thoracic extension mobs over FR Supine horizontal abduction with blue 2 x 15 Supine chin tuck with lift 2 x 10 Seated double  ER and scap retraction with blue 2 x 15 Row with blue 2 x 15 Extension with green 2 x 15   [facility] Adult PT Treatment:                                                DATE: 04/13/22 Therapeutic Exercise: UBE L3 x 4 min (2 fwd/bwd) while taking subjective Sidelying thoracic rotation x 10 each Supine thoracic extension mobs over FR Corner pec stretch 3 x 30 sec Supine chin tuck 10 x 5 sec Supine chin tuck with lift 2 x 10 Supine horizontal abduction with green 2 x 15 Prone I 10 x 5 sec Prone T 10 x 5 sec Child's pose stretch with physioball 5 x 10 sec Seated double ER and scap retraction with green 2 x 20 Row with blue 2 x 20  [facility] Adult PT Treatment:                                                DATE: 04/06/22 Therapeutic Exercise: UBE L3 x 4 min (2 fwd/bwd) while taking subjective Seated upper trap stretch 2 x 20 sec each Corner pec stretch 2 x 30 sec Sidelying thoracic rotation x 10 each Supine thoracic extension mobs over FR Cat cow x 10 Seated double ER and scap retraction with red 2 x 20 Seated horizontal abduction with red 2 x 15 Row with blue 2 x 20 Extension with green 2 x 15 Manual Therapy: Skilled palpation and monitoring of muscle tension while performing TPDN STM bilateral upper traps Trigger Point Dry Needling Treatment: Pre-treatment instruction: Patient instructed on dry needling rationale, procedures, and possible side effects including pain during treatment (achy,cramping feeling), bruising, drop of blood, lightheadedness, nausea, sweating. Patient Consent Given: Yes Education handout provided: Previously provided Muscles treated: Bilateral upper trap  Needle size and number: .30x46m x 4 Treatment response/outcome: Twitch response elicited and Palpable decrease in muscle tension Post-treatment instructions: Patient instructed to expect possible mild to moderate muscle soreness later today and/or tomorrow. Patient instructed  in methods to reduce muscle soreness  and to continue prescribed HEP. If patient was dry needled over the lung field, patient was instructed on signs and symptoms of pneumothorax and, however unlikely, to see immediate medical attention should they occur. Patient was also educated on signs and symptoms of infection and to seek medical attention should they occur. Patient verbalized understanding of these instructions and education.   PATIENT EDUCATION:  Education details: HEP Person educated: Patient Education method: Education officer, environmental, Corporate treasurer cues, Verbal cues, Handout Education comprehension: verbalized understanding, returned demonstration, verbal cues required, tactile cues required, and needs further education   HOME EXERCISE PROGRAM: Access Code: [email or web address]     ASSESSMENT: CLINICAL IMPRESSION: Patient tolerated therapy well with no adverse effects. Therapy limited on time due to patient arriving late, and therapy focused primarily on progressing postural control and strength with good tolerance. She does continue to require cueing for proper postural control to avoid shrug and rounded shoulders, and she reported left upper trap soreness following therapy. No changes to HEP this visit. Patient would benefit from continued skilled PT to progress her mobility and strength in order to reduce pain and maximize functional ability.    OBJECTIVE IMPAIRMENTS decreased activity tolerance, decreased endurance, decreased ROM, decreased strength, impaired flexibility, postural dysfunction, and pain.    ACTIVITY LIMITATIONS carrying, lifting, sitting, and hygiene/grooming   PARTICIPATION LIMITATIONS: meal prep, cleaning, driving, and occupation   PERSONAL FACTORS Past/current experiences and Time since onset of injury/illness/exacerbation are also affecting patient's functional outcome.      GOALS: Goals reviewed with patient? Yes   SHORT TERM GOALS: Target date: 03/13/2022    Patient will be I with initial HEP in order to  progress with therapy. Baseline: HEP provided at eval 03/03/2022: independent Goal status: MET   2.  PT will review FOTO with patient by 3rd visit in order to understand expected progress and outcome with therapy. Baseline: FOTO assessed at eval 03/03/2022: reviewed Goal status: MET   3.  Patient will report pain level </= 4/10 in order to reduce functional limitations Baseline: 7/10 03/03/2022: 5/10 pain level 04/13/2022: 5/10 Goal status: PARTIALLY MET   LONG TERM GOALS: Target date: 05/11/2022   Patient will be I with final HEP to maintain progress from PT. Baseline: HEP provided at eval 04/13/2022: progressing Goal status: ONGOING   2.  Patient will report >/= 64% status on FOTO to indicate improved functional ability. Baseline: 53% functional status 03/10/2022: 58% 04/13/2022: 51% Goal status: PARTIALLY MET   3.  Patient will demonstrate left cervical rotation AROM >/= 60 deg in order to improve driving ability. Baseline: 40 deg 04/13/2022: 60 deg Goal status: MET   4.  Patient will demonstrate DNF endurance >/= 30 seconds in order to improve postural control and reduce neck pain. Baseline: 12 seconds 04/13/2022: 15 seconds Goal status: PARTIALLY MET     PLAN: PT FREQUENCY: 1x/week   PT DURATION: 4 weeks   PLANNED INTERVENTIONS: Therapeutic exercises, Therapeutic activity, Neuromuscular re-education, Balance training, Gait training, Patient/Family education, Self Care, Joint mobilization, Joint manipulation, Aquatic Therapy, Dry Needling, Electrical stimulation, Spinal manipulation, Spinal mobilization, Cryotherapy, Moist heat, Taping, Manual therapy, and Re-evaluation   PLAN FOR NEXT SESSION: Review HEP and progress PRN, manual/dry needling for upper trap and neck region, cervical and thoracic mobility, progress postural control   Hilda Blades, PT, DPT, LAT, ATC 04/27/22  3:33 PM Phone: [phone] Fax: [phone]

## 2022-04-28 ENCOUNTER — Encounter: Payer: Self-pay | Admitting: Gastroenterology

## 2022-04-29 ENCOUNTER — Ambulatory Visit
Admission: RE | Admit: 2022-04-29 | Discharge: 2022-04-29 | Disposition: A | Discharge status: Home / Self Care | Payer: BC Managed Care – PPO | Source: Ambulatory Visit | Attending: Internal Medicine | Admitting: Internal Medicine

## 2022-04-29 DIAGNOSIS — Z1231 Encounter for screening mammogram for malignant neoplasm of breast: Secondary | ICD-10-CM

## 2022-05-04 ENCOUNTER — Other Ambulatory Visit: Payer: Self-pay

## 2022-05-04 ENCOUNTER — Encounter: Payer: Self-pay | Admitting: Physical Therapy

## 2022-05-04 ENCOUNTER — Ambulatory Visit: Payer: BC Managed Care – PPO | Admitting: Physical Therapy

## 2022-05-04 DIAGNOSIS — R293 Abnormal posture: Secondary | ICD-10-CM

## 2022-05-04 DIAGNOSIS — M6281 Muscle weakness (generalized): Secondary | ICD-10-CM

## 2022-05-04 DIAGNOSIS — M542 Cervicalgia: Secondary | ICD-10-CM

## 2022-05-04 NOTE — Therapy (Signed)
OUTPATIENT PHYSICAL THERAPY TREATMENT NOTE  DISCHARGE   Patient Name: Faith [name] MRN: [id number] DOB:January 23, 1956, 66 y.o., female Today's Date: 05/04/2022  PCP: Michael Boston, MD REFERRING PROVIDER: Marcial Pacas, MD   END OF SESSION:   PT End of Session - 05/04/22 1447     Visit Number 11    Number of Visits 13    Date for PT Re-Evaluation 05/11/22    Authorization Type BCBS    PT Start Time 1446    PT Stop Time 3546    PT Time Calculation (min) 44 min    Activity Tolerance Patient tolerated treatment well    Behavior During Therapy WFL for tasks assessed/performed                      Past Medical History:  Diagnosis Date   Allergy    spring    Anemia    yrs ago    Arthritis    Family history of colon cancer in father    Gastric ulcer    GERD (gastroesophageal reflux disease)    Hypertension    MVP (mitral valve prolapse)    Nerve damage 2023   Osteopenia    Past Surgical History:  Procedure Laterality Date   BREAST EXCISIONAL BIOPSY Right 1986   BREAST REDUCTION SURGERY     CHOLECYSTECTOMY     COLONOSCOPY     GASTRIC BYPASS     KNEE ARTHROSCOPY     right   REDUCTION MAMMAPLASTY Bilateral 2001   SKIN CANCER EXCISION     TEMPOROMANDIBULAR JOINT SURGERY     TONSILLECTOMY AND ADENOIDECTOMY     TUBAL LIGATION     UPPER GASTROINTESTINAL ENDOSCOPY     Patient Active Problem List   Diagnosis Date Noted   Neck pain 01/29/2022   Snores 01/29/2022   Coronary artery calcification 10/10/2019   Hyperlipidemia 56/81/2751   Periumbilical pain 70/05/7492   Generalized abdominal pain 09/05/2019   Elevated lipase 09/05/2019   Chest pain of uncertain etiology 49/67/5916   Osteoarthritis of right knee 01/14/2016   Memory loss 08/22/2014   Hyperreflexia 08/22/2014   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 She reports some neck and shoulder tightness.   PAIN:  Are you having pain? Yes:  NPRS scale: 5/10 (7/10 pain at worse) Pain location: Neck (base of scull) and bilateral shoulders (L > R) Pain description: Tightness, soreness, "nails" Aggravating factors: Unknown Relieving factors: Medication   OBJECTIVE: (objective measures completed at initial evaluation unless otherwise dated) PATIENT SURVEYS:  FOTO 53% functional status  03/10/2022: 58% 04/13/2022: 51% 05/04/2022: 59%   POSTURE:  Rounded shoulder and forward head posture   PALPATION: Tender to palpation bilateral upper trap, cervical paraspinals, suboccipitals              CERVICAL ROM:    Active ROM A/PROM (deg) eval 02/25/22 04/06/2022 04/13/2022  Flexion 70     Extension 30  35 35  Right lateral flexion _0 Left lateral flexion _1 Right rotation 55 55 55 55  Left rotation 40 60 60 60    UPPER EXTREMITY MMT:  MMT Right eval Left eval Rt / Lt 03/31/2022 Rt / Lt 04/13/2022  Shoulder flexion 5 5    Shoulder extension 5 5    Shoulder abduction 5 5    Shoulder internal rotation 5 5    Shoulder external rotation 5 5    Middle trapezius 4- 4- 4- / 4- 4- / 4-  Lower trapezius 4- 4- 4- / 4- 4- / 4-    CERVICAL SPECIAL TESTS:  Radicular testing grossly negative, patient does report muscular tightness at end range   FUNCTIONAL TESTS:  DNF endurance: 12 seconds  04/13/2022: 15 seconds 05/04/2022: 16 seconds     TODAY'S TREATMENT:  [facility] Adult PT Treatment:                                                DATE: 05/04/22 Therapeutic Exercise: UBE L3 x 4 min (2 fwd/bwd) while taking subjective Corner pec stretch 3 x 30 sec Sidelying thoracic rotation x 10 each Supine thoracic extension mobs over  FR Quadruped thoracic extension with physioball x 10 Quadruped thoracic rotation x 5 each Supine chin tuck with lift 2 x 10 Supine horizontal abduction with blue 2 x 15 Seated double ER and scap retraction with blue 2 x 15 Low row machine 25# 2 x 15 Manual Therapy: Skilled palpation and monitoring of muscle tension while performing TPDN STM bilateral upper traps Suboccipital release with gentle manual traction Trigger Point Dry Needling Treatment: Pre-treatment instruction: Patient instructed on dry needling rationale, procedures, and possible side effects including pain during treatment (achy,cramping feeling), bruising, drop of blood, lightheadedness, nausea, sweating. Patient Consent Given: Yes Education handout provided: Previously provided Muscles treated: Bilateral upper trap, right suboccipital Needle size and number: .30x105m x 4 Treatment response/outcome: Twitch response elicited and Palpable decrease in muscle tension Post-treatment instructions: Patient instructed to expect possible mild to moderate muscle soreness later today and/or tomorrow. Patient instructed in methods to reduce muscle soreness and to continue prescribed HEP. If patient was dry needled over the lung field, patient was instructed on signs and symptoms of pneumothorax and, however unlikely, to see immediate medical attention should they occur. Patient was also educated on signs and symptoms of infection and to seek medical attention should they occur. Patient verbalized understanding of these instructions and education.   [facility]                                                [facility]: [facility] Therapeutic Exercise: UBE L3 x 4 min (2 fwd/bwd) while taking subjective Corner pec stretch 3 x 30 sec Sidelying thoracic rotation x 10 each Supine thoracic extension mobs over FR Supine horizontal abduction with blue 2 x 15 Supine chin tuck with lift 2 x 10 Seated double ER and scap retraction with blue  2 x 15 Row with blue 2 x 15 Extension with green 2 x 15  [facility] Adult PT Treatment:                                                DATE: 04/13/22 Therapeutic Exercise: UBE  L3 x 4 min (2 fwd/bwd) while taking subjective Sidelying thoracic rotation x 10 each Supine thoracic extension mobs over FR Corner pec stretch 3 x 30 sec Supine chin tuck 10 x 5 sec Supine chin tuck with lift 2 x 10 Supine horizontal abduction with green 2 x 15 Prone I 10 x 5 sec Prone T 10 x 5 sec Child's pose stretch with physioball 5 x 10 sec Seated double ER and scap retraction with green 2 x 20 Row with blue 2 x 20   PATIENT EDUCATION:  Education details: POC discharge, HEP Person educated: Patient Education method: Explanation, Demonstration, Tactile cues, Verbal cues Education comprehension: verbalized understanding, returned demonstration, verbal cues required, tactile cues required, and needs further education   HOME EXERCISE PROGRAM: Access Code: [email or web address]     ASSESSMENT: CLINICAL IMPRESSION: Patient tolerated therapy well with no adverse effects. She has progressed in therapy, reporting improvement in her functional ability on FOTO, improved motion, strength and endurance. She has partially met her LTGs but is ready to discharge due to being pleased with her current functional level. No changes made to HEP this visit. Patient will be formally discharged from PT to independent HEP.     OBJECTIVE IMPAIRMENTS decreased activity tolerance, decreased endurance, decreased ROM, decreased strength, impaired flexibility, postural dysfunction, and pain.    ACTIVITY LIMITATIONS carrying, lifting, sitting, and hygiene/grooming   PARTICIPATION LIMITATIONS: meal prep, cleaning, driving, and occupation   PERSONAL FACTORS Past/current experiences and Time since onset of injury/illness/exacerbation are also affecting patient's functional outcome.      GOALS: Goals reviewed with patient? Yes   SHORT TERM GOALS:  Target date: 03/13/2022    Patient will be I with initial HEP in order to progress with therapy. Baseline: HEP provided at eval 03/03/2022: independent Goal status: MET   2.  PT will review FOTO with patient by 3rd visit in order to understand expected progress and outcome with therapy. Baseline: FOTO assessed at eval 03/03/2022: reviewed Goal status: MET   3.  Patient will report pain level </= 4/10 in order to reduce functional limitations Baseline: 7/10 03/03/2022: 5/10 pain level 04/13/2022: 5/10 05/04/2022: 5/10 Goal status: PARTIALLY MET   LONG TERM GOALS: Target date: 05/11/2022   Patient will be I with final HEP to maintain progress from PT. Baseline: HEP provided at eval 04/13/2022: progressing 05/04/2022: independent  Goal status: MET   2.  Patient will report >/= 64% status on FOTO to indicate improved functional ability. Baseline: 53% functional status 03/10/2022: 58% 04/13/2022: 51% 05/04/2022: 59% Goal status: PARTIALLY MET   3.  Patient will demonstrate left cervical rotation AROM >/= 60 deg in order to improve driving ability. Baseline: 40 deg 04/13/2022: 60 deg Goal status: MET   4.  Patient will demonstrate DNF endurance >/= 30 seconds in order to improve postural control and reduce neck pain. Baseline: 12 seconds 04/13/2022: 15 seconds 05/04/2022: 16 seconds Goal status: PARTIALLY MET     PLAN: PT FREQUENCY: 1x/week   PT DURATION: 4 weeks   PLANNED INTERVENTIONS: Therapeutic exercises, Therapeutic activity, Neuromuscular re-education, Balance training, Gait training, Patient/Family education, Self Care, Joint mobilization, Joint manipulation, Aquatic Therapy, Dry Needling, Electrical stimulation, Spinal manipulation, Spinal mobilization, Cryotherapy, Moist heat, Taping, Manual therapy, and Re-evaluation   PLAN FOR NEXT SESSION: NA - discharge   Hilda Blades, PT, DPT, LAT, ATC 05/04/22  3:33 PM Phone: [phone] Fax:  [phone]    PHYSICAL THERAPY DISCHARGE SUMMARY  Visits from Start of Care: 11  Current functional  level related to goals / functional outcomes: See above   Remaining deficits: See above   Education / Equipment: HEP   Patient agrees to discharge. Patient goals were partially met. Patient is being discharged due to being pleased with the current functional level.

## 2022-05-05 ENCOUNTER — Encounter: Payer: Self-pay | Admitting: Gastroenterology

## 2022-05-05 ENCOUNTER — Ambulatory Visit (AMBULATORY_SURGERY_CENTER): Payer: BC Managed Care – PPO | Admitting: Gastroenterology

## 2022-05-05 VITALS — BP 175/79 | HR 58 | Temp 96.2°F | Resp 18 | Ht 63.0 in | Wt 161.0 lb

## 2022-05-05 DIAGNOSIS — K625 Hemorrhage of anus and rectum: Secondary | ICD-10-CM

## 2022-05-05 DIAGNOSIS — J3081 Allergic rhinitis due to animal (cat) (dog) hair and dander: Secondary | ICD-10-CM | POA: Diagnosis not present

## 2022-05-05 DIAGNOSIS — J301 Allergic rhinitis due to pollen: Secondary | ICD-10-CM | POA: Diagnosis not present

## 2022-05-05 DIAGNOSIS — D122 Benign neoplasm of ascending colon: Secondary | ICD-10-CM

## 2022-05-05 DIAGNOSIS — R1310 Dysphagia, unspecified: Secondary | ICD-10-CM

## 2022-05-05 DIAGNOSIS — J3089 Other allergic rhinitis: Secondary | ICD-10-CM | POA: Diagnosis not present

## 2022-05-05 DIAGNOSIS — K2100 Gastro-esophageal reflux disease with esophagitis, without bleeding: Secondary | ICD-10-CM | POA: Diagnosis not present

## 2022-05-05 DIAGNOSIS — R1319 Other dysphagia: Secondary | ICD-10-CM

## 2022-05-05 DIAGNOSIS — K219 Gastro-esophageal reflux disease without esophagitis: Secondary | ICD-10-CM | POA: Diagnosis not present

## 2022-05-05 MED ORDER — SODIUM CHLORIDE 0.9 % IV SOLN: 500.0000 mL | Freq: Once | INTRAVENOUS | Status: DC

## 2022-05-05 NOTE — Patient Instructions (Signed)
Handouts on hiatal hernia, GERD, polyps, and hemorrhoids provided.  Await pathology results.  Please see Dr. Woodward Ku recommendations on her procedure reports.   YOU HAD AN ENDOSCOPIC PROCEDURE TODAY AT [facility]:   Refer to the procedure report that was given to you for any specific questions about what was found during the examination.  If the procedure report does not answer your questions, please call your gastroenterologist to clarify.  If you requested that your care partner not be given the details of your procedure findings, then the procedure report has been included in a sealed envelope for you to review at your convenience later.  YOU SHOULD EXPECT: Some feelings of bloating in the abdomen. Passage of more gas than usual.  Walking can help get rid of the air that was put into your GI tract during the procedure and reduce the bloating. If you had a lower endoscopy (such as a colonoscopy or flexible sigmoidoscopy) you may notice spotting of blood in your stool or on the toilet paper. If you underwent a bowel prep for your procedure, you may not have a normal bowel movement for a few days.  Please Note:  You might notice some irritation and congestion in your nose or some drainage.  This is from the oxygen used during your procedure.  There is no need for concern and it should clear up in a day or so.  SYMPTOMS TO REPORT IMMEDIATELY:  Following lower endoscopy (colonoscopy or flexible sigmoidoscopy):  Excessive amounts of blood in the stool  Significant tenderness or worsening of abdominal pains  Swelling of the abdomen that is new, acute  Fever of 100F or higher  Following upper endoscopy (EGD)  Vomiting of blood or coffee ground material  New chest pain or pain under the shoulder blades  Painful or persistently difficult swallowing  New shortness of breath  Fever of 100F or higher  Black, tarry-looking stools  For urgent or emergent issues, a  gastroenterologist can be reached at any hour by calling [phone]. Do not use MyChart messaging for urgent concerns.    DIET:  We do recommend a small meal at first, but then you may proceed to your regular diet.  Drink plenty of fluids but you should avoid alcoholic beverages for 24 hours.  ACTIVITY:  You should plan to take it easy for the rest of today and you should NOT DRIVE or use heavy machinery until tomorrow (because of the sedation medicines used during the test).    FOLLOW UP: Our staff will call the number listed on your records the next business day following your procedure.  We will call around 7:15- 8:00 am to check on you and address any questions or concerns that you may have regarding the information given to you following your procedure. If we do not reach you, we will leave a message.     If any biopsies were taken you will be contacted by phone or by letter within the next 1-3 weeks.  Please call us at [phone] if you have not heard about the biopsies in 3 weeks.    SIGNATURES/CONFIDENTIALITY: You and/or your care partner have signed paperwork which will be entered into your electronic medical record.  These signatures attest to the fact that that the information above on your After Visit Summary has been reviewed and is understood.  Full responsibility of the confidentiality of this discharge information lies with you and/or your care-partner.

## 2022-05-05 NOTE — Op Note (Signed)
[place] Patient Name: Faith [name] Procedure Date: 05/05/2022 3:18 PM MRN: [id number] Endoscopist: Mauri Pole , MD, [id number] Age: 66 Referring MD:  Date of Birth: 11-Mar-1956 Gender: Female Account #: [id number] Procedure:                Colonoscopy Indications:              Evaluation of unexplained GI bleeding presenting                            with Hematochezia Medicines:                Monitored Anesthesia Care Procedure:                Pre-Anesthesia Assessment:                           - Prior to the procedure, a History and Physical                            was performed, and patient medications and                            allergies were reviewed. The patient's tolerance of                            previous anesthesia was also reviewed. The risks                            and benefits of the procedure and the sedation                            options and risks were discussed with the patient.                            All questions were answered, and informed consent                            was obtained. Prior Anticoagulants: The patient has                            taken no anticoagulant or antiplatelet agents. ASA                            Grade Assessment: II - A patient with mild systemic                            disease. After reviewing the risks and benefits,                            the patient was deemed in satisfactory condition to                            undergo the procedure.  After obtaining informed consent, the colonoscope                            was passed under direct vision. Throughout the                            procedure, the patient's blood pressure, pulse, and                            oxygen saturations were monitored continuously. The                            [id number]-HQ190L Colonoscope was introduced through the                            anus and advanced to the the  cecum, identified by                            appendiceal orifice and ileocecal valve. The                            colonoscopy was performed without difficulty. The                            patient tolerated the procedure well. The quality                            of the bowel preparation was good. The ileocecal                            valve, appendiceal orifice, and rectum were                            photographed. Scope In: 3:35:14 PM Scope Out: 4:03:43 PM Scope Withdrawal Time: 0 hours 13 minutes 9 seconds  Total Procedure Duration: 0 hours 28 minutes 29 seconds  Findings:                 The perianal and digital rectal examinations were                            normal.                           An 11 mm polyp was found in the ascending colon.                            The polyp was sessile. The polyp was removed with a                            cold snare. Resection and retrieval were complete.                           Non-bleeding internal hemorrhoids were found during  retroflexion. The hemorrhoids were small.                           The exam was otherwise without abnormality. Complications:            No immediate complications. Estimated Blood Loss:     Estimated blood loss was minimal. Impression:               - One 11 mm polyp in the ascending colon, removed                            with a cold snare. Resected and retrieved.                           - Non-bleeding internal hemorrhoids.                           - The examination was otherwise normal. Recommendation:           - Patient has a contact number available for                            emergencies. The signs and symptoms of potential                            delayed complications were discussed with the                            patient. Return to normal activities tomorrow.                            Written discharge instructions were provided to the                             patient.                           - Resume previous diet.                           - Continue present medications.                           - Await pathology results.                           - Repeat colonoscopy in 3 - 5 years for                            surveillance based on pathology results. (Personal                            h/o colon polyps and family h/o colon cancer) Mauri Pole, MD 05/05/2022 4:09:12 PM This report has been signed electronically.

## 2022-05-05 NOTE — Op Note (Signed)
[place] Patient Name: Faith [name] Procedure Date: 05/05/2022 3:18 PM MRN: [id number] Endoscopist: Mauri Pole , MD, [id number] Age: 66 Referring MD:  Date of Birth: 10-24-1955 Gender: Female Account #: [id number] Procedure:                Upper GI endoscopy Indications:              Dysphagia Medicines:                Monitored Anesthesia Care Procedure:                Pre-Anesthesia Assessment:                           - Prior to the procedure, a History and Physical                            was performed, and patient medications and                            allergies were reviewed. The patient's tolerance of                            previous anesthesia was also reviewed. The risks                            and benefits of the procedure and the sedation                            options and risks were discussed with the patient.                            All questions were answered, and informed consent                            was obtained. Prior Anticoagulants: The patient has                            taken no anticoagulant or antiplatelet agents. ASA                            Grade Assessment: II - A patient with mild systemic                            disease. After reviewing the risks and benefits,                            the patient was deemed in satisfactory condition to                            undergo the procedure.                           After obtaining informed consent, the endoscope was  passed under direct vision. Throughout the                            procedure, the patient's blood pressure, pulse, and                            oxygen saturations were monitored continuously. The                            GIF HQ190 #[id number] was introduced through the                            mouth, and advanced to the second part of duodenum.                            The upper GI endoscopy was  accomplished without                            difficulty. The patient tolerated the procedure                            well. Scope In: Scope Out: Findings:                 The Z-line was regular and was found 36 cm from the                            incisors.                           No endoscopic abnormality was evident in the                            esophagus to explain the patient's complaint of                            dysphagia. It was decided, however, to proceed with                            dilation of the entire esophagus. The scope was                            withdrawn. Dilation was performed with a Maloney                            dilator with no resistance at 48 Fr. The dilation                            site was examined following endoscope reinsertion                            and showed no change. Biopsies were obtained from  the proximal and distal esophagus with cold forceps                            for histology of suspected eosinophilic esophagitis.                           A 3 cm hiatal hernia was present.                           Evidence of a Roux-en-Y gastrojejunostomy was                            found. This was traversed. The pouch-to-jejunum                            limb was characterized by erosion and erythema. The                            jejunojejunal anastomosis was characterized by                            healthy appearing mucosa.                           The examined jejunum was normal. Complications:            No immediate complications. Estimated Blood Loss:     Estimated blood loss was minimal. Impression:               - Z-line regular, 36 cm from the incisors.                           - No endoscopic esophageal abnormality to explain                            patient's dysphagia. Esophagus dilated. Dilated.                           - 3 cm hiatal hernia.                           -  Roux-en-Y gastrojejunostomy.                           - Normal examined jejunum.                           - Biopsies were taken with a cold forceps for                            evaluation of eosinophilic esophagitis. Recommendation:           - Patient has a contact number available for                            emergencies. The signs and symptoms of potential  delayed complications were discussed with the                            patient. Return to normal activities tomorrow.                            Written discharge instructions were provided to the                            patient.                           - Resume previous diet.                           - Continue present medications.                           - Await pathology results.                           - Follow an antireflux regimen. Mauri Pole, MD 05/05/2022 4:13:45 PM This report has been signed electronically.

## 2022-05-05 NOTE — Progress Notes (Signed)
Vss nad trans to pacu °

## 2022-05-05 NOTE — Progress Notes (Signed)
Called to room to assist during endoscopic procedure.  Patient ID and intended procedure confirmed with present staff. Received instructions for my participation in the procedure from the performing physician.  

## 2022-05-05 NOTE — Progress Notes (Signed)
[facility] History and Physical   Primary Care Physician:  Michael Boston, MD   Reason for Procedure:  Dysphagia, GERD, rectal bleeding, family h/o colon cancer  Plan:    EGD and colonoscopy with possible interventions as needed     HPI: Faith [name] is a very pleasant 66 y.o. female here for EGD for evaluation of dysphagia, esophageal biopsies and dilation as needed, colonoscopy for evaluation of rectal bleeding.   The risks and benefits as well as alternatives of endoscopic procedure(s) have been discussed and reviewed. All questions answered. The patient agrees to proceed.    Past Medical History:  Diagnosis Date   Allergy    spring    Anemia    yrs ago    Arthritis    Family history of colon cancer in father    Gastric ulcer    GERD (gastroesophageal reflux disease)    Hyperlipidemia    Hypertension    MVP (mitral valve prolapse)    Nerve damage 2023   Osteopenia    Osteoporosis     Past Surgical History:  Procedure Laterality Date   BREAST EXCISIONAL BIOPSY Right 1986   BREAST REDUCTION SURGERY     CHOLECYSTECTOMY     COLONOSCOPY     GASTRIC BYPASS     KNEE ARTHROSCOPY     right   REDUCTION MAMMAPLASTY Bilateral 2001   SKIN CANCER EXCISION     TEMPOROMANDIBULAR JOINT SURGERY     TONSILLECTOMY AND ADENOIDECTOMY     TUBAL LIGATION     UPPER GASTROINTESTINAL ENDOSCOPY      Prior to Admission medications   Medication Sig Start Date End Date Taking? Authorizing Provider  AMBULATORY NON FORMULARY MEDICATION Probioslim Extra Strength Probiotic, fat burner and bloat Tae 2 tablet by mouth daily   Yes [provider]  B Complex Vitamins (VITAMIN B COMPLEX PO) Take 1 tablet by mouth daily.   Yes [provider]  CALCIUM PO Take 1 tablet by mouth daily. Adult chewable calcium supplement   Yes [provider]  DULoxetine (CYMBALTA) 60 MG capsule Take 1 capsule (60 mg total) by mouth daily. 01/29/22  Yes Marcial Pacas, MD   lisinopril (ZESTRIL) 20 MG tablet TK 1 T PO D   Yes [provider]  losartan (COZAAR) 50 MG tablet TAKE 1 TABLET(50 MG) BY MOUTH DAILY 12/24/21  Yes Nahser, Wonda Cheng, MD  Multiple Vitamin (MULITIVITAMIN WITH MINERALS) TABS Take 3 tablets by mouth daily. Adult gummy multi-vitamins   Yes [provider]  omeprazole (PRILOSEC) 20 MG capsule Take 20 mg by mouth daily. 11/21/21  Yes [provider]  Probiotic Product (PROBIOTIC DAILY PO) Take 1 capsule daily by mouth.   Yes [provider]  rosuvastatin (CRESTOR) 20 MG tablet Take 1 tablet (20 mg total) by mouth daily. 12/24/21  Yes Nahser, Wonda Cheng, MD  VITAMIN D PO Take 1 tablet by mouth daily.   Yes [provider]  AUVI-Q 0.3 MG/0.3ML SOAJ injection Inject 0.3 mg into the muscle as needed. 03/01/19   [provider]  azelastine (ASTELIN) 0.1 % nasal spray  08/07/20   [provider]  Ferrous Sulfate (IRON PO) Take 1 tablet by mouth daily.    [provider]  levocetirizine (XYZAL) 5 MG tablet Take 1 tablet by mouth daily. 10/04/17   [provider]  traZODone (DESYREL) 50 MG tablet Take 1 tablet (50 mg total) by mouth at bedtime as needed for sleep. Patient not taking:  Reported on 05/05/2022 03/04/22   Dohmeier, Asencion Partridge, MD    Current Outpatient Medications  Medication Sig Dispense Refill   AMBULATORY NON FORMULARY MEDICATION Probioslim Extra Strength Probiotic, fat burner and bloat Tae 2 tablet by mouth daily     B Complex Vitamins (VITAMIN B COMPLEX PO) Take 1 tablet by mouth daily.     CALCIUM PO Take 1 tablet by mouth daily. Adult chewable calcium supplement     DULoxetine (CYMBALTA) 60 MG capsule Take 1 capsule (60 mg total) by mouth daily. 30 capsule 5   lisinopril (ZESTRIL) 20 MG tablet TK 1 T PO D     losartan (COZAAR) 50 MG tablet TAKE 1 TABLET(50 MG) BY MOUTH DAILY 90 tablet 3   Multiple Vitamin (MULITIVITAMIN WITH MINERALS) TABS Take 3 tablets by mouth  daily. Adult gummy multi-vitamins     omeprazole (PRILOSEC) 20 MG capsule Take 20 mg by mouth daily.     Probiotic Product (PROBIOTIC DAILY PO) Take 1 capsule daily by mouth.     rosuvastatin (CRESTOR) 20 MG tablet Take 1 tablet (20 mg total) by mouth daily. 90 tablet 3   VITAMIN D PO Take 1 tablet by mouth daily.     AUVI-Q 0.3 MG/0.3ML SOAJ injection Inject 0.3 mg into the muscle as needed.     azelastine (ASTELIN) 0.1 % nasal spray  (Patient not taking: Reported on 05/05/2022)     Ferrous Sulfate (IRON PO) Take 1 tablet by mouth daily.     levocetirizine (XYZAL) 5 MG tablet Take 1 tablet by mouth daily.  3   traZODone (DESYREL) 50 MG tablet Take 1 tablet (50 mg total) by mouth at bedtime as needed for sleep. (Patient not taking: Reported on 05/05/2022) 30 tablet 0   Current Facility-Administered Medications  Medication Dose Route Frequency Provider Last Rate Last Admin   0.9 %  sodium chloride infusion  500 mL Intravenous Once Mauri Pole, MD        Allergies as of 05/05/2022   (No Known Allergies)    Family History  Adopted: Yes  Problem Relation Age of Onset   Colon cancer Father 99    Social History   Socioeconomic History   Marital status: Married    Spouse name: Not on file   Number of children: 2   Years of education: Not on file   Highest education level: Not on file  Occupational History   Occupation: Mental Health Therapist  Tobacco Use   Smoking status: Never   Smokeless tobacco: Never  Vaping Use   Vaping Use: Never used  Substance and Sexual Activity   Alcohol use: Yes    Alcohol/week: 0.0 standard drinks of alcohol    Comment: rare   Drug use: No   Sexual activity: Not on file  Other Topics Concern   Not on file  Social History Narrative   Not on file   Social Determinants of Health   Financial Resource Strain: Not on file  Food Insecurity: Not on file  Transportation Needs: Not on file  Physical Activity: Not on file  Stress: Not on  file  Social Connections: Not on file  Intimate Partner Violence: Not on file    Review of Systems:  All other review of systems negative except as mentioned in the HPI.  Physical Exam: Vital signs in last 24 hours: Blood Pressure (Abnormal) 182/92   Pulse (Abnormal) 53   Temperature (Abnormal) 96.2 F (35.7 C)   Height '5\' 3"'$  (1.6 m)  Weight 161 lb (73 kg)   Oxygen Saturation 99%   Body Mass Index 28.52 kg/m  General:   Alert, NAD Lungs:  Clear .   Heart:  Regular rate and rhythm Abdomen:  Soft, nontender and nondistended. Neuro/Psych:  Alert and cooperative. Normal mood and affect. A and O x 3  Reviewed labs, radiology imaging, old records and pertinent past GI work up  Patient is appropriate for planned procedure(s) and anesthesia in an ambulatory setting   K. Denzil Magnuson , MD [phone]

## 2022-05-06 ENCOUNTER — Telehealth: Payer: Self-pay | Admitting: *Deleted

## 2022-05-06 NOTE — Telephone Encounter (Signed)
Attempted f/u phone call. No answer. Left message. °

## 2022-05-08 ENCOUNTER — Telehealth: Payer: Self-pay | Admitting: Gastroenterology

## 2022-05-08 NOTE — Telephone Encounter (Signed)
Patient calling inquiring about results.

## 2022-05-08 NOTE — Telephone Encounter (Signed)
Patient call to f/u. Advised that provider will reach out at their earliest convenience.

## 2022-05-14 ENCOUNTER — Encounter: Payer: Self-pay | Admitting: Gastroenterology

## 2022-05-20 DIAGNOSIS — J3081 Allergic rhinitis due to animal (cat) (dog) hair and dander: Secondary | ICD-10-CM | POA: Diagnosis not present

## 2022-05-20 DIAGNOSIS — J3089 Other allergic rhinitis: Secondary | ICD-10-CM | POA: Diagnosis not present

## 2022-05-20 DIAGNOSIS — J301 Allergic rhinitis due to pollen: Secondary | ICD-10-CM | POA: Diagnosis not present

## 2022-06-02 DIAGNOSIS — J301 Allergic rhinitis due to pollen: Secondary | ICD-10-CM | POA: Diagnosis not present

## 2022-06-02 DIAGNOSIS — J3089 Other allergic rhinitis: Secondary | ICD-10-CM | POA: Diagnosis not present

## 2022-06-02 DIAGNOSIS — J3081 Allergic rhinitis due to animal (cat) (dog) hair and dander: Secondary | ICD-10-CM | POA: Diagnosis not present

## 2022-06-02 NOTE — Telephone Encounter (Signed)
Patient called, is requesting call back. She has questions about her results. States that she was told that she has GERD but is taking 20 mg omeprazole everyday. Is wondering if there is anything else she should take or if that is something she would have to be on indefinitely.   The results also show she had hemorrhoids, patient states she had some rectal bleeding this morning, is wondering if there are any over the counter remedies for hemorrhoids or something she can be prescribed.   Stated a polyp was taken out, and it was precancerous, she is requesting recall be put in for two years instead of three years because of her family hx of colon cancer from her father.   Please advise.

## 2022-06-03 NOTE — Telephone Encounter (Signed)
Please schedule office visit to schedule to discuss all her concerns including evaluation of BRBPR and also possible consideration of hemorrhoid band ligation.  Advise patient to use Preparation H cream per rectum twice daily as needed

## 2022-06-04 NOTE — Telephone Encounter (Signed)
Discussed with the patient. Appointment scheduled for first opening 07/07/22 at 2:50 pm.

## 2022-06-09 DIAGNOSIS — J301 Allergic rhinitis due to pollen: Secondary | ICD-10-CM | POA: Diagnosis not present

## 2022-06-10 DIAGNOSIS — J3089 Other allergic rhinitis: Secondary | ICD-10-CM | POA: Diagnosis not present

## 2022-06-16 DIAGNOSIS — J301 Allergic rhinitis due to pollen: Secondary | ICD-10-CM | POA: Diagnosis not present

## 2022-06-16 DIAGNOSIS — J3081 Allergic rhinitis due to animal (cat) (dog) hair and dander: Secondary | ICD-10-CM | POA: Diagnosis not present

## 2022-06-16 DIAGNOSIS — J3089 Other allergic rhinitis: Secondary | ICD-10-CM | POA: Diagnosis not present

## 2022-07-06 DIAGNOSIS — J3089 Other allergic rhinitis: Secondary | ICD-10-CM | POA: Diagnosis not present

## 2022-07-06 DIAGNOSIS — J3081 Allergic rhinitis due to animal (cat) (dog) hair and dander: Secondary | ICD-10-CM | POA: Diagnosis not present

## 2022-07-06 DIAGNOSIS — J301 Allergic rhinitis due to pollen: Secondary | ICD-10-CM | POA: Diagnosis not present

## 2022-07-07 ENCOUNTER — Encounter: Payer: Self-pay | Admitting: Gastroenterology

## 2022-07-07 ENCOUNTER — Ambulatory Visit (INDEPENDENT_AMBULATORY_CARE_PROVIDER_SITE_OTHER): Payer: BC Managed Care – PPO | Admitting: Gastroenterology

## 2022-07-07 VITALS — BP 126/80 | HR 52 | Ht 63.0 in | Wt 166.0 lb

## 2022-07-07 DIAGNOSIS — K219 Gastro-esophageal reflux disease without esophagitis: Secondary | ICD-10-CM

## 2022-07-07 DIAGNOSIS — Z86010 Personal history of colonic polyps: Secondary | ICD-10-CM

## 2022-07-07 DIAGNOSIS — Z8719 Personal history of other diseases of the digestive system: Secondary | ICD-10-CM

## 2022-07-07 DIAGNOSIS — K8500 Idiopathic acute pancreatitis without necrosis or infection: Secondary | ICD-10-CM | POA: Diagnosis not present

## 2022-07-07 NOTE — Progress Notes (Signed)
Faith [name]    JI:[id number]    [birth year]  Primary Care Physician:Wile, Jesse Sans, MD  Referring Physician: Michael Boston, [address],  [place] [id number]   Chief complaint: Hemorrhoids, GERD, Colonoscopy discussion  HPI: 67 year old very pleasant female with history of gastric bypass surgery and cholecystectomy in 2009 is here for follow-up visit for hemorrhoids, GERD, and colonoscopy discussion.  She was last seen by me on 11/08/2019 after recent episode of mild pancreatitis.  She had complaints of generalized fatigue at that time.  Today, she states that she has been doing well overall. She wonders when she should repeat her colonoscopy given her Hx of polyps and family Hx. Her father passed away from colon cancer at 1 years old.  She is taking Omeprazole '20mg'$  daily and wonders what else she could do to mitigate her reflux symptoms. Globus sensation has improved s/p EGD with esophageal dilation She is no longer coughing, back pain is better she continues to have occasional tingling sensation  She reports rare episodes of mild bleeding after passing bowel movements which she notices after wiping. She attributes this to her hemorrhoids. She denies any associated pain or itching. She deferred hemorrhoid banding today.   GI Hx:  Colonoscopy 05/05/2022: - The perianal and digital rectal examinations were normal. - An 11 mm polyp was found in the ascending colon. The polyp was sessile. The polyp was removed with a cold snare. Resection and retrieval were complete. - Non-bleeding internal hemorrhoids were found during retroflexion. The hemorrhoids were small. - The exam was otherwise without abnormality.  Upper endoscopy 05/05/2022: - The Z-line was regular and was found 36 cm from the incisors. - No endoscopic abnormality was evident in the esophagus to explain the patient's complaint of dysphagia. It was decided, however, to proceed with dilation of the entire  esophagus. The scope was withdrawn. Dilation was performed with a Maloney dilator with no resistance at 48 Fr. The dilation site was examined following endoscope reinsertion and showed no change. Biopsies were obtained from the proximal and distal esophagus with cold forceps for histology of suspected eosinophilic esophagitis. - A 3 cm hiatal hernia was present. - Evidence of a Roux-en-Y gastrojejunostomy was found. This was traversed. The pouch-tojejunum limb was characterized by erosion and erythema. The jejunojejunal anastomosis was characterized by healthy appearing mucosa. - The examined jejunum was normal.  Pathology 05/05/2022: Diagnosis 1. Surgical [P], distal esophagus - REFLUX ESOPHAGITIS - NEGATIVE FOR DYSPLASIA OR MALIGNANCY 2. Surgical [P], proximal esophagus - REFLUX ESOPHAGITIS - NEGATIVE FOR DYSPLASIA OR MALIGNANCY 3. Surgical [P], colon, ascending, polyp (1) - TUBULAR ADENOMA. - NO HIGH GRADE DYSPLASIA OR MALIGNANCY.   CT abdomen pelvis with contrast Sep 22, 2019 Mass lesion in the right hepatic lobe consistent with benign focal nodular hyperplasia, no significant change in size as compared to prior MRI in 2018 S/p cholecystectomy.  Normal pancreas. Stable postop changes s/p gastric bypass surgery Stable benign appearing left adrenal adenoma Uterine fibroid  EGD 04/02/2017: - Normal esophagus. - Roux-en-Y gastrojejunostomy with gastrojejunal anastomosis characterized by healthy appearing mucosa.  - Normal examined jejunum.   Colonoscopy 04/02/2017:  - One 4 mm polyp in the ascending colon, removed with a cold snare. Resected and retrieved. - Two 1 to 2 mm polyps in the sigmoid colon and in the cecum, removed with a cold biopsy forceps. - Non-bleeding internal hemorrhoids.  -5 year recall.   Biologic father had colon  cancer at age 46  Biopsies:  63. Surgical [P], ascending and cecum, polyp (2) - TUBULAR ADENOMA(S). - HIGH GRADE DYSPLASIA IS NOT IDENTIFIED. 2.  Surgical [P], sigmoid, polyp - HYPERPLASTIC POLYP(S). - THERE IS NO EVIDENCE OF MALIGNANCY   Abdominal MRI 02/16/2017: 1. Hypervascular 2.4 cm inferior right liver lobe mass is stable since 04/01/2016 CT abdomen study and demonstrates MRI features compatible with benign focal nodular hyperplasia ([place]). 2. Left adrenal adenoma. 3. No acute abnormality   Current Outpatient Medications:    AMBULATORY NON FORMULARY MEDICATION, Probioslim Extra Strength Probiotic, fat burner and bloat Tae 2 tablet by mouth daily, Disp: , Rfl:    AUVI-Q 0.3 MG/0.3ML SOAJ injection, Inject 0.3 mg into the muscle as needed., Disp: , Rfl:    azelastine (ASTELIN) 0.1 % nasal spray, , Disp: , Rfl:    B Complex Vitamins (VITAMIN B COMPLEX PO), Take 1 tablet by mouth daily., Disp: , Rfl:    CALCIUM PO, Take 1 tablet by mouth daily. Adult chewable calcium supplement, Disp: , Rfl:    DULoxetine (CYMBALTA) 60 MG capsule, Take 1 capsule (60 mg total) by mouth daily., Disp: 30 capsule, Rfl: 5   Ferrous Sulfate (IRON PO), Take 1 tablet by mouth daily., Disp: , Rfl:    levocetirizine (XYZAL) 5 MG tablet, Take 1 tablet by mouth daily., Disp: , Rfl: 3   lisinopril (ZESTRIL) 20 MG tablet, TK 1 T PO D, Disp: , Rfl:    losartan (COZAAR) 50 MG tablet, TAKE 1 TABLET(50 MG) BY MOUTH DAILY, Disp: 90 tablet, Rfl: 3   Multiple Vitamin (MULITIVITAMIN WITH MINERALS) TABS, Take 3 tablets by mouth daily. Adult gummy multi-vitamins, Disp: , Rfl:    omeprazole (PRILOSEC) 20 MG capsule, Take 20 mg by mouth daily., Disp: , Rfl:    Probiotic Product (PROBIOTIC DAILY PO), Take 1 capsule daily by mouth., Disp: , Rfl:    rosuvastatin (CRESTOR) 20 MG tablet, Take 1 tablet (20 mg total) by mouth daily., Disp: 90 tablet, Rfl: 3   traZODone (DESYREL) 50 MG tablet, Take 1 tablet (50 mg total) by mouth at bedtime as needed for sleep., Disp: 30 tablet, Rfl: 0   VITAMIN D PO, Take 1 tablet by mouth daily., Disp: , Rfl:      Allergies as of 07/07/2022    (No Known Allergies)    Past Medical History:  Diagnosis Date   Allergy    spring    Anemia    yrs ago    Arthritis    Family history of colon cancer in father    Gastric ulcer    GERD (gastroesophageal reflux disease)    Hyperlipidemia    Hypertension    MVP (mitral valve prolapse)    Nerve damage 2023   Osteopenia    Osteoporosis     Past Surgical History:  Procedure Laterality Date   BREAST EXCISIONAL BIOPSY Right 1986   BREAST REDUCTION SURGERY     CHOLECYSTECTOMY     COLONOSCOPY     GASTRIC BYPASS     KNEE ARTHROSCOPY     right   REDUCTION MAMMAPLASTY Bilateral 2001   SKIN CANCER EXCISION     TEMPOROMANDIBULAR JOINT SURGERY     TONSILLECTOMY AND ADENOIDECTOMY     TUBAL LIGATION     UPPER GASTROINTESTINAL ENDOSCOPY      Family History  Adopted: Yes  Problem Relation Age of Onset   Colon cancer Father 44   Stomach cancer Neg Hx    Esophageal cancer  Neg Hx    Pancreatic cancer Neg Hx     Social History   Socioeconomic History   Marital status: Married    Spouse name: Not on file   Number of children: 2   Years of education: Not on file   Highest education level: Not on file  Occupational History   Occupation: Mental Health Therapist  Tobacco Use   Smoking status: Never   Smokeless tobacco: Never  Vaping Use   Vaping Use: Never used  Substance and Sexual Activity   Alcohol use: Yes    Alcohol/week: 0.0 standard drinks of alcohol    Comment: rare   Drug use: No   Sexual activity: Not on file  Other Topics Concern   Not on file  Social History Narrative   Not on file   Social Determinants of Health   Financial Resource Strain: Not on file  Food Insecurity: Not on file  Transportation Needs: Not on file  Physical Activity: Not on file  Stress: Not on file  Social Connections: Not on file  Intimate Partner Violence: Not on file      Review of systems: Review of Systems  Constitutional:  Negative for appetite change and fever.   HENT:  Negative for trouble swallowing.   Respiratory:  Negative for cough and shortness of breath.   Cardiovascular:  Negative for chest pain.  Gastrointestinal:  Positive for anal bleeding. Negative for abdominal distention, abdominal pain, blood in stool, constipation, diarrhea, nausea, rectal pain and vomiting.  Genitourinary:  Negative for dysuria.  Musculoskeletal:  Negative for back pain.  Skin:  Negative for rash.  Neurological:  Negative for weakness.  All other systems reviewed and are negative.     Physical Exam: Vitals:   07/07/22 1446  BP: 126/80  Pulse: (!) 52  SpO2: 98%    Body mass index is 29.41 kg/m.  General: well-appearing NAD  Eyes: sclera anicteric, no redness ENT: oral mucosa moist without lesions, no cervical or supraclavicular lymphadenopathy CV: RRR, no JVD, no peripheral edema Resp: clear to auscultation bilaterally, normal RR and effort noted GI: soft, no tenderness, with active bowel sounds. No guarding or palpable organomegaly noted. Skin; warm and dry, no rash or jaundice noted Neuro: awake, alert and oriented x 3. Normal gross motor function and fluent speech   Data Reviewed:  Reviewed labs, radiology imaging, old records and pertinent past GI work up   Assessment and Plan/Recommendations:  67 year old very pleasant female with history of gastric bypass surgery, s/p cholecystectomy in 2009 here for follow-up for GERD, hemorrhoids, and colonoscopy recall discussion.  GERD: Discussed antireflux measures Encouraged her to eat her last meal at least 3 hours before going to bed.  Advised to try wedge to raise head end. Continue Omeprazole '20mg'$  daily, will consider tapering the dose if her symptoms continue to improve  History of adenomatous colon polyps and family history of colon cancer: Surveillance colonoscopy done December 2023.  Recommended 3 year recall.   Hemorrhoid symptoms are mild and only occasional at this time. Patient  deferred hemorrhoid banding today.  Advised to start Benefiber supplementation. Maintain adequate hydration  Status post gastric bypass surgery, continue taking bariatric multivitamins daily.   Unclear etiology for pancreatitis: No recent recurrent episodes CT abdomen pelvis negative for any pancreatic cyst lesions or acute inflammation   Return in 3 months or sooner if needed    CC: Wile, Jesse Sans, MD    I,Alexis Herring,acting as a scribe for Harl Bowie, MD.,have  documented all relevant documentation on the behalf of Harl Bowie, MD,as directed by  Harl Bowie, MD while in the presence of Harl Bowie, MD.  I, Harl Bowie, MD, have reviewed all documentation for this visit. The documentation on 07/07/22 for the exam, diagnosis, procedures, and orders are all accurate and complete.   This visit required 40 minutes of patient care (this includes precharting, chart review, review of results, face-to-face time used for counseling as well as treatment plan and follow-up. The patient was provided an opportunity to ask questions and all were answered. The patient agreed with the plan and demonstrated an understanding of the instructions.  Damaris Hippo , MD

## 2022-07-07 NOTE — Patient Instructions (Signed)
We have sent the following medications to your pharmacy for you to pick up at your convenience:  Omeprazole  Take Benefiber 1 tablespoon twice a day   Follow up in 3 months   _______________________________________________________  If your blood pressure at your visit was 140/90 or greater, please contact your primary care physician to follow up on this.  _______________________________________________________  If you are age 66 or older, your body mass index should be between 23-30. Your Body mass index is 29.41 kg/m. If this is out of the aforementioned range listed, please consider follow up with your Primary Care Provider.  If you are age 59 or younger, your body mass index should be between 19-25. Your Body mass index is 29.41 kg/m. If this is out of the aformentioned range listed, please consider follow up with your Primary Care Provider.   ________________________________________________________  The [facility] providers would like to encourage you to use [facility] to communicate with providers for non-urgent requests or questions.  Due to long hold times on the telephone, sending your provider a message by [facility] may be a faster and more efficient way to get a response.  Please allow 48 business hours for a response.  Please remember that this is for non-urgent requests.  _______________________________________________________   I appreciate the  opportunity to care for you  Thank You   Harl Bowie , MD

## 2022-07-15 DIAGNOSIS — I2510 Atherosclerotic heart disease of native coronary artery without angina pectoris: Secondary | ICD-10-CM | POA: Diagnosis not present

## 2022-07-15 DIAGNOSIS — I712 Thoracic aortic aneurysm, without rupture, unspecified: Secondary | ICD-10-CM | POA: Diagnosis not present

## 2022-07-15 DIAGNOSIS — I10 Essential (primary) hypertension: Secondary | ICD-10-CM | POA: Diagnosis not present

## 2022-07-15 DIAGNOSIS — E785 Hyperlipidemia, unspecified: Secondary | ICD-10-CM | POA: Diagnosis not present

## 2022-07-16 ENCOUNTER — Ambulatory Visit: Payer: BC Managed Care – PPO | Admitting: Neurology

## 2022-07-22 DIAGNOSIS — J3081 Allergic rhinitis due to animal (cat) (dog) hair and dander: Secondary | ICD-10-CM | POA: Diagnosis not present

## 2022-07-22 DIAGNOSIS — J3089 Other allergic rhinitis: Secondary | ICD-10-CM | POA: Diagnosis not present

## 2022-07-22 DIAGNOSIS — J301 Allergic rhinitis due to pollen: Secondary | ICD-10-CM | POA: Diagnosis not present

## 2022-07-31 ENCOUNTER — Other Ambulatory Visit (HOSPITAL_COMMUNITY): Payer: Self-pay | Admitting: *Deleted

## 2022-08-04 ENCOUNTER — Ambulatory Visit (HOSPITAL_COMMUNITY)
Admission: RE | Admit: 2022-08-04 | Discharge: 2022-08-04 | Disposition: A | Discharge status: Home / Self Care | Payer: BC Managed Care – PPO | Source: Ambulatory Visit | Attending: Internal Medicine | Admitting: Internal Medicine

## 2022-08-04 ENCOUNTER — Telehealth: Payer: Self-pay | Admitting: Cardiovascular Disease

## 2022-08-04 DIAGNOSIS — M810 Age-related osteoporosis without current pathological fracture: Secondary | ICD-10-CM | POA: Insufficient documentation

## 2022-08-04 MED ORDER — DENOSUMAB 60 MG/ML ~~LOC~~ SOSY
PREFILLED_SYRINGE | SUBCUTANEOUS | Status: AC
  Filled 2022-08-04: qty 1

## 2022-08-04 MED ORDER — DENOSUMAB 60 MG/ML ~~LOC~~ SOSY
60.0000 mg | PREFILLED_SYRINGE | Freq: Once | SUBCUTANEOUS | Status: AC
  Administered 2022-08-04: 60 mg via SUBCUTANEOUS

## 2022-08-04 NOTE — Telephone Encounter (Signed)
Patient is calling to talk with Dr. Acie Fredrickson or nurse about her BP being high (176/88). Says that she can feel her BP being high

## 2022-08-07 MED ORDER — AMLODIPINE BESYLATE 5 MG PO TABS: 5.0000 mg | ORAL_TABLET | Freq: Every day | ORAL | 3 refills | Status: DC

## 2022-08-07 NOTE — Telephone Encounter (Signed)
Attempted to call patient back about her blood pressure, left detailed message for her to call back.

## 2022-08-07 NOTE — Telephone Encounter (Signed)
Pt returned call and states that BP has been up for "a couple of weeks". States she went to PCP office for 6 month f/u and they increased her Losartan to 100mg  daily, but it didn't impact her readings so they added Amlodipine 2.5mg  daily, approx 3 days ago. Verified that she is still taking her Lisinopril 20mg  as well. Has been doing BP meds at night, but added Amlodipine to morning routine. Patient bought a BP kit and began to check BP, but was checking whenever she thought about it, so not consistent with times. Provides following log: 3/21 169/98   151/92 3/20 165/96   152/90 3/19 166/94 She states she is having some associated chest tightness at times. She denies headaches, shortness of breath, or vision changes. At time of call she checks her BP and reports 187/99 (she has taken her Amlodipine about an hour ago, but not other medications). Advised her to go ahead and take the Lisinopril and Losartan and will call her back today to see how this has impacted her. Routing to MD for thoughts. Scheduled for f/u appt w/Nahser on 08/18/22.

## 2022-08-07 NOTE — Telephone Encounter (Signed)
Returned call to patient with below recommendations from Dr Acie Fredrickson. She verbalized understanding and provided verbal read-back of medication changes. Amlodipine 5mg  sent to [facility] pharmacy per request. She will keep 08/18/22 appt as scheduled. Lisinpril removed from chart.   Nahser, Wonda Cheng, MD  You2 hours ago (1:01 PM)   Her BP was well controlled when I saw her a year ago I she not getting any benefit from being on Lisinopril and Losartan DC lisinopril Increase amlodipine to 5 mg a day  I will see her in 2 weeks to reasses.

## 2022-08-10 DIAGNOSIS — J3081 Allergic rhinitis due to animal (cat) (dog) hair and dander: Secondary | ICD-10-CM | POA: Diagnosis not present

## 2022-08-10 DIAGNOSIS — J301 Allergic rhinitis due to pollen: Secondary | ICD-10-CM | POA: Diagnosis not present

## 2022-08-10 DIAGNOSIS — J3089 Other allergic rhinitis: Secondary | ICD-10-CM | POA: Diagnosis not present

## 2022-08-11 ENCOUNTER — Telehealth: Payer: Self-pay | Admitting: Cardiovascular Disease

## 2022-08-11 NOTE — Telephone Encounter (Signed)
Pt c/o BP issue: STAT if pt c/o blurred vision, one-sided weakness or slurred speech  1. What are your last 5 BP readings?  3/26: 158/91          165/82  2. Are you having any other symptoms (ex. Dizziness, headache, blurred vision, passed out)?  Chest tightness  3. What is your BP issue?   Patient is following up. BP remains elevated (see recent telephonic encounter) and she would like to know if she can be worked in for an appointment sooner than 4/02 if at all possible. I informed her that Dr. Acie Fredrickson is not in the office this week and she mentions that an APP would be fine, but I do not show any openings prior to 4/02. Patient also mentions chest tightness over the past few days.

## 2022-08-11 NOTE — Telephone Encounter (Signed)
Returned call to patient.  Patient reports elevated BP today: 165/82 and 158/91  BP readings from phone note on 08/07/22: 3/21     169/98   151/92 3/20     165/96   152/90 3/19     166/94  Patient states she has been having chest tightness since last week. She described it as feeling "a weight on my chest." She reports feeling mild pain in back today and some weakness.   Denies SOB, nausea, no increased salt intake, denies blurred vision. She reports occasional headache and occasional dizziness. States she is staying well hydrated, drinks green tea/herbal teas.  She is currently taking amlodipine 5mg  QD and losartan 100mg  QD. Checking BP 1-2 hours after taking BP meds.   Requesting sooner appt than 04/02 with Dr. Acie Fredrickson. Offered next available with DOD Dr. Lovena Le on 08/13/22 to evaluate symptoms. Patient accepted. Advised on ED precautions. Patient verbalized understanding.  Will forward to Dr. Acie Fredrickson to review and advise.

## 2022-08-13 ENCOUNTER — Encounter: Payer: Self-pay | Admitting: Internal Medicine

## 2022-08-13 ENCOUNTER — Ambulatory Visit: Payer: BC Managed Care – PPO | Attending: Internal Medicine | Admitting: Internal Medicine

## 2022-08-13 VITALS — BP 114/82 | HR 64 | Ht 63.0 in | Wt 163.6 lb

## 2022-08-13 DIAGNOSIS — I10 Essential (primary) hypertension: Secondary | ICD-10-CM

## 2022-08-13 DIAGNOSIS — R0602 Shortness of breath: Secondary | ICD-10-CM

## 2022-08-13 DIAGNOSIS — R42 Dizziness and giddiness: Secondary | ICD-10-CM | POA: Insufficient documentation

## 2022-08-13 DIAGNOSIS — R0789 Other chest pain: Secondary | ICD-10-CM | POA: Insufficient documentation

## 2022-08-13 DIAGNOSIS — R519 Headache, unspecified: Secondary | ICD-10-CM | POA: Insufficient documentation

## 2022-08-13 NOTE — Patient Instructions (Addendum)
Medication Instructions:  Your physician recommends that you continue on your current medications as directed. Please refer to the Current Medication list given to you today.  *If you need a refill on your cardiac medications before your next appointment, please call your pharmacy*  Lab Work: You will have BMET, CBC with Diff, TSH, Sed Rate today.    Testing/Procedures: 2 D Echocardiogram ordered by Dr. Cristopher Peru.  Follow-Up: At [facility], you and your health needs are our priority.  As part of our continuing mission to provide you with exceptional heart care, we have created designated Provider Care Teams.  These Care Teams include your primary Cardiologist (physician) and Advanced Practice Providers (APPs -  Physician Assistants and Nurse Practitioners) who all work together to provide you with the care you need, when you need it.   Your next appointment:   You will follow up with Dr. Acie Fredrickson once the lab and Echocardiogram results are available;  Orders per Dr. Cristopher Peru.  The format for your next appointment:   In Person  Provider:   Cristopher Peru, MD{or one of the following Advanced Practice Providers on your designated Care Team:   Tommye Standard, [place] Legrand Como "Jonni Sanger" Chalmers Cater, [place] Echocardiogram An echocardiogram is a test that uses sound waves (ultrasound) to produce images of the heart. Images from an echocardiogram can provide important information about: Heart size and shape. The size and thickness and movement of your heart's walls. Heart muscle function and strength. Heart valve function or if you have stenosis. Stenosis is when the heart valves are too narrow. If blood is flowing backward through the heart valves (regurgitation). A tumor or infectious growth around the heart valves. Areas of heart muscle that are not working well because of poor blood flow or injury from a heart attack. Aneurysm detection. An aneurysm is a weak or damaged part of an artery wall.  The wall bulges out from the normal force of blood pumping through the body. Tell a health care provider about: Any allergies you have. All medicines you are taking, including vitamins, herbs, eye drops, creams, and over-the-counter medicines. Any blood disorders you have. Any surgeries you have had. Any medical conditions you have. Whether you are pregnant or may be pregnant. What are the risks? Generally, this is a safe test. However, problems may occur, including an allergic reaction to dye (contrast) that may be used during the test. What happens before the test? No specific preparation is needed. You may eat and drink normally. What happens during the test?  You will take off your clothes from the waist up and put on a hospital gown. Electrodes or electrocardiogram (ECG)patches may be placed on your chest. The electrodes or patches are then connected to a device that monitors your heart rate and rhythm. You will lie down on a table for an ultrasound exam. A gel will be applied to your chest to help sound waves pass through your skin. A handheld device, called a transducer, will be pressed against your chest and moved over your heart. The transducer produces sound waves that travel to your heart and bounce back (or "echo" back) to the transducer. These sound waves will be captured in real-time and changed into images of your heart that can be viewed on a video monitor. The images will be recorded on a computer and reviewed by your health care provider. You may be asked to change positions or hold your breath for a short time. This makes it easier to get  different views or better views of your heart. In some cases, you may receive contrast through an IV in one of your veins. This can improve the quality of the pictures from your heart. The procedure may vary among health care providers and hospitals. What can I expect after the test? You may return to your normal, everyday life, including  diet, activities, and medicines, unless your health care provider tells you not to do that. Follow these instructions at home: It is up to you to get the results of your test. Ask your health care provider, or the department that is doing the test, when your results will be ready. Keep all follow-up visits. This is important. Summary An echocardiogram is a test that uses sound waves (ultrasound) to produce images of the heart. Images from an echocardiogram can provide important information about the size and shape of your heart, heart muscle function, heart valve function, and other possible heart problems. You do not need to do anything to prepare before this test. You may eat and drink normally. After the echocardiogram is completed, you may return to your normal, everyday life, unless your health care provider tells you not to do that. This information is not intended to replace advice given to you by your health care provider. Make sure you discuss any questions you have with your health care provider. Document Revised: 01/15/2021 Document Reviewed: 12/26/2019 Elsevier Patient Education  [place].

## 2022-08-13 NOTE — Progress Notes (Signed)
HPI Faith [name] is referred today for a DOD visit for chest pain, sob and malaise. 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 She has non-exertional chest pressure. She c/o feeling poorly for over a week. No fever or chills or URI symptoms. No UTI symptoms. She has not sought medical attention until today. She notes that she has been under worsening stress but started feeling poorly before the stress. She did not elaborate.  No Known Allergies   Current Outpatient Medications  Medication Sig Dispense Refill   AMBULATORY NON FORMULARY MEDICATION Probioslim Extra Strength Probiotic, fat burner and bloat Tae 2 tablet by mouth daily     amLODipine (NORVASC) 5 MG tablet Take 1 tablet (5 mg total) by mouth daily. 90 tablet 3   AUVI-Q 0.3 MG/0.3ML SOAJ injection Inject 0.3 mg into the muscle as needed.     azelastine (ASTELIN) 0.1 % nasal spray      B Complex Vitamins (VITAMIN B COMPLEX PO) Take 1 tablet by mouth daily.     CALCIUM PO Take 1 tablet by mouth daily. Adult chewable calcium supplement     dicyclomine (BENTYL) 20 MG tablet Take 20 mg by mouth in the morning and at bedtime.     DULoxetine (CYMBALTA) 60 MG capsule Take 1 capsule (60 mg total) by mouth daily. 30 capsule 5   Ferrous Sulfate (IRON PO) Take 1 tablet by mouth daily.     levocetirizine (XYZAL) 5 MG tablet Take 1 tablet by mouth daily.  3   losartan (COZAAR) 100 MG tablet Take 100 mg by mouth daily.     montelukast (SINGULAIR) 10 MG tablet Take 10 mg by mouth at bedtime.     Multiple Vitamin (MULITIVITAMIN WITH MINERALS) TABS Take 3 tablets by mouth daily. Adult gummy multi-vitamins     omeprazole (PRILOSEC) 20 MG capsule Take 20 mg by mouth daily.     Probiotic Product (PROBIOTIC DAILY PO) Take 1 capsule daily by mouth.     rosuvastatin (CRESTOR) 20 MG tablet Take 1 tablet (20 mg total) by mouth daily. 90 tablet 3    traZODone (DESYREL) 50 MG tablet Take 1 tablet (50 mg total) by mouth at bedtime as needed for sleep. 30 tablet 0   tretinoin (RETIN-A) 0.1 % cream SMARTSIG:Topical Every Evening     VITAMIN D PO Take 1 tablet by mouth daily.     No current facility-administered medications for this visit.     Past Medical History:  Diagnosis Date   Allergy    spring    Anemia    yrs ago    Arthritis    Family history of colon cancer in father    Gastric ulcer    GERD (gastroesophageal reflux disease)    Hyperlipidemia    Hypertension    MVP (mitral valve prolapse)    Nerve damage 2023   Osteopenia    Osteoporosis     ROS:   All systems reviewed and negative except as noted in the HPI.   Past Surgical History:  Procedure Laterality Date   BREAST EXCISIONAL BIOPSY Right 1986   BREAST REDUCTION SURGERY     CHOLECYSTECTOMY     COLONOSCOPY     GASTRIC BYPASS     KNEE ARTHROSCOPY     right   REDUCTION MAMMAPLASTY Bilateral 2001   SKIN CANCER EXCISION  TEMPOROMANDIBULAR JOINT SURGERY     TONSILLECTOMY AND ADENOIDECTOMY     TUBAL LIGATION     UPPER GASTROINTESTINAL ENDOSCOPY       Family History  Adopted: Yes  Problem Relation Age of Onset   Colon cancer Father 79   Stomach cancer Neg Hx    Esophageal cancer Neg Hx    Pancreatic cancer Neg Hx      Social History   Socioeconomic History   Marital status: Married    Spouse name: Not on file   Number of children: 2   Years of education: Not on file   Highest education level: Not on file  Occupational History   Occupation: Mental Health Therapist  Tobacco Use   Smoking status: Never   Smokeless tobacco: Never  Vaping Use   Vaping Use: Never used  Substance and Sexual Activity   Alcohol use: Yes    Alcohol/week: 0.0 standard drinks of alcohol    Comment: rare   Drug use: No   Sexual activity: Not on file  Other Topics Concern   Not on file  Social History Narrative   Not on file   Social Determinants of  Health   Financial Resource Strain: Not on file  Food Insecurity: Not on file  Transportation Needs: Not on file  Physical Activity: Not on file  Stress: Not on file  Social Connections: Not on file  Intimate Partner Violence: Not on file     BP 114/82   Pulse 64   Ht 5\' 3"  (1.6 m)   Wt 163 lb 9.6 oz (74.2 kg)   SpO2 97%   BMI 28.98 kg/m   Physical Exam:  Well appearing NAD HEENT: Unremarkable Neck:  No JVD, no thyromegally Lymphatics:  No adenopathy Back:  No CVA tenderness Lungs:  Clear with no wheezes HEART:  Regular rate rhythm, no murmurs, no rubs, no clicks Abd:  soft, positive bowel sounds, no organomegally, no rebound, no guarding Ext:  2 plus pulses, no edema, no cyanosis, no clubbing Skin:  No rashes no nodules Neuro:  CN II through XII intact, motor grossly intact  EKG - nsr with no STT changes   Assess/Plan: Chest pain (non-cardiac), malaise, sob - I suspect that she has a viral syndrome. I have asked her to obtain a CBC, BMP, TSH, and ESR. Because of her sob (no rales on exam and no murmur), I have asked to obtain a 2D echo. She will follow with Faith [name]. Faith [name]

## 2022-08-14 ENCOUNTER — Telehealth: Payer: Self-pay | Admitting: Internal Medicine

## 2022-08-14 LAB — BASIC METABOLIC PANEL
BUN/Creatinine Ratio: 25 (ref 12–28)
BUN: 15 mg/dL (ref 8–27)
CO2: 18 mmol/L — ABNORMAL LOW (ref 20–29)
Calcium: 9 mg/dL (ref 8.7–10.3)
Chloride: 107 mmol/L — ABNORMAL HIGH (ref 96–106)
Creatinine, Ser: 0.61 mg/dL (ref 0.57–1.00)
Glucose: 85 mg/dL (ref 70–99)
Potassium: 3.9 mmol/L (ref 3.5–5.2)
Sodium: 145 mmol/L — ABNORMAL HIGH (ref 134–144)
eGFR: 99 mL/min/{1.73_m2} (ref >59)

## 2022-08-14 LAB — TSH: TSH: 1.31 u[IU]/mL (ref 0.450–4.500)

## 2022-08-14 LAB — SEDIMENTATION RATE: Sed Rate: 2 mm/hr (ref 0–40)

## 2022-08-14 LAB — CBC WITH DIFFERENTIAL/PLATELET
Basophils Absolute: 0 10*3/uL (ref 0.0–0.2)
Basos: 0 %
EOS (ABSOLUTE): 0.1 10*3/uL (ref 0.0–0.4)
Eos: 1 %
Hematocrit: 45 % (ref 34.0–46.6)
Hemoglobin: 15.2 g/dL (ref 11.1–15.9)
Immature Grans (Abs): 0 10*3/uL (ref 0.0–0.1)
Immature Granulocytes: 0 %
Lymphocytes Absolute: 2.1 10*3/uL (ref 0.7–3.1)
Lymphs: 28 %
MCH: 32.9 pg (ref 26.6–33.0)
MCHC: 33.8 g/dL (ref 31.5–35.7)
MCV: 97 fL (ref 79–97)
Monocytes Absolute: 0.3 10*3/uL (ref 0.1–0.9)
Monocytes: 4 %
Neutrophils Absolute: 5.2 10*3/uL (ref 1.4–7.0)
Neutrophils: 67 %
Platelets: 191 10*3/uL (ref 150–450)
RBC: 4.62 x10E6/uL (ref 3.77–5.28)
RDW: 12.3 % (ref 11.7–15.4)
WBC: 7.8 10*3/uL (ref 3.4–10.8)

## 2022-08-14 NOTE — Telephone Encounter (Signed)
Nahser, Wonda Cheng, MD  You2 hours ago (10:41 AM)   BP was normal yesterday at visit with Dr Lovena Le BMP, CBC, sed rate, TSH were all within normal limits She has an echo scheduled  PN   Returned call to patient and reviewed above comments. No further questions.

## 2022-08-14 NOTE — Telephone Encounter (Signed)
Patient is requesting a call back to discuss lab results. 

## 2022-08-14 NOTE — Telephone Encounter (Signed)
Returned call to patient.  Informed her Dr. Lovena Le has not yet had a chance to review her lab results from yesterday.  Patient requests we forward lab results to Dr. Cathie Olden to review to see if she can get a response sooner.  Will forward to Dr. Tanna Furry nurse and Dr. Acie Fredrickson to review and advise.

## 2022-08-17 ENCOUNTER — Telehealth: Payer: Self-pay

## 2022-08-17 NOTE — Telephone Encounter (Signed)
Pt called per message received from Dr. Cristopher Peru.  Dr. Lovena Le stated:  ----- Message from Evans Lance, MD sent at 08/16/2022  9:00 PM EDT ----- Let her know that her labs wer all ok except it looked like she might be a little dry. Probably should follow up with her medical MD. No thyroid problem or evidence of infection. GT     Pt understood, and stated she will schedule a follow up appointment with her PCP.

## 2022-08-17 NOTE — Telephone Encounter (Signed)
-----   Message from Evans Lance, MD sent at 08/16/2022  9:00 PM EDT ----- Let her know that her labs wer all ok except it looked like she might be a little dry. Probably should follow up with her medical MD. No thyroid problem or evidence of infection. GT

## 2022-08-18 ENCOUNTER — Ambulatory Visit: Payer: BC Managed Care – PPO | Admitting: Cardiovascular Disease

## 2022-08-18 ENCOUNTER — Ambulatory Visit (HOSPITAL_COMMUNITY): Payer: BC Managed Care – PPO | Attending: Cardiology

## 2022-08-18 DIAGNOSIS — R0602 Shortness of breath: Secondary | ICD-10-CM | POA: Diagnosis not present

## 2022-08-18 LAB — ECHOCARDIOGRAM COMPLETE
Area-P 1/2: 3.27 cm2
Calc EF: 63.1 %
S' Lateral: 2.9 cm
Single Plane A2C EF: 62.1 %
Single Plane A4C EF: 63.4 %

## 2022-08-20 ENCOUNTER — Telehealth: Payer: Self-pay

## 2022-08-20 NOTE — Telephone Encounter (Signed)
-----   Message from Evans Lance, MD sent at 08/18/2022  9:13 PM EDT ----- Heart is good and strong and valve function is normal.

## 2022-09-01 DIAGNOSIS — J301 Allergic rhinitis due to pollen: Secondary | ICD-10-CM | POA: Diagnosis not present

## 2022-09-01 DIAGNOSIS — J3089 Other allergic rhinitis: Secondary | ICD-10-CM | POA: Diagnosis not present

## 2022-09-01 DIAGNOSIS — J3081 Allergic rhinitis due to animal (cat) (dog) hair and dander: Secondary | ICD-10-CM | POA: Diagnosis not present

## 2022-09-09 DIAGNOSIS — J301 Allergic rhinitis due to pollen: Secondary | ICD-10-CM | POA: Diagnosis not present

## 2022-09-09 DIAGNOSIS — J3089 Other allergic rhinitis: Secondary | ICD-10-CM | POA: Diagnosis not present

## 2022-09-14 DIAGNOSIS — J3089 Other allergic rhinitis: Secondary | ICD-10-CM | POA: Diagnosis not present

## 2022-09-14 DIAGNOSIS — J301 Allergic rhinitis due to pollen: Secondary | ICD-10-CM | POA: Diagnosis not present

## 2022-09-14 DIAGNOSIS — J3081 Allergic rhinitis due to animal (cat) (dog) hair and dander: Secondary | ICD-10-CM | POA: Diagnosis not present

## 2022-09-15 ENCOUNTER — Other Ambulatory Visit (HOSPITAL_COMMUNITY): Payer: BC Managed Care – PPO

## 2022-09-22 ENCOUNTER — Encounter: Payer: Self-pay | Admitting: Cardiovascular Disease

## 2022-09-22 DIAGNOSIS — J3089 Other allergic rhinitis: Secondary | ICD-10-CM | POA: Diagnosis not present

## 2022-09-22 DIAGNOSIS — J019 Acute sinusitis, unspecified: Secondary | ICD-10-CM | POA: Diagnosis not present

## 2022-09-22 DIAGNOSIS — R052 Subacute cough: Secondary | ICD-10-CM | POA: Diagnosis not present

## 2022-09-22 DIAGNOSIS — J301 Allergic rhinitis due to pollen: Secondary | ICD-10-CM | POA: Diagnosis not present

## 2022-09-22 NOTE — Progress Notes (Unsigned)
Cardiology Office Note:    Date:  09/24/2022   ID:  Faith [name], DOB [birth year], MRN [id number]  PCP:  Melida Quitter, MD  Cardiologist:  Kadedra Vanaken  Electrophysiologist:  None   Referring MD: Melida Quitter, MD   Chief Complaint  Patient presents with   Hyperlipidemia   Chest Pain     previous notes:   Faith [name] is a 67 y.o. female with a hx of supraventricular tachycardia.  I have seen her many years ago.  I saw her several months ago as a home visit when she called complaining of chest pain.  She had been having chest pain primarily centered under her left breast.  There was some left shoulder  discomfort as well.   Radiating to back - . Lasted 1-2 hours.  Then gradually resolved.  Kardia tele did nto reveal any ST changed.  The following day she had right-sided chest discomfort.  Pain there is at this pain lasted all day long. Had some dyspnea but no pleuretic cp   Lipids in June, 2020 showed chol of 225, LDL was 138.  Trigs were 55, HDL was 76   Oct 10, 2019 Faith [name] is seen today for follow up of her chest pain  Coronary CT angio shows mild disease in the LAD.  The LCx and RCA are free of disease.  Coronary calcium score of 19. This was 71 percentile for age and sex matched control. She has been started on Rosuvastatin 10 mg a day  Recent  lipid levels reveal:  Chol = 142 HDL is 83 Trigs are 58 LDL is 47  Oct 10, 2019:  Faith [name] is seen today for follow up of her mild CAD, chest pain and coronary calcium score if 19. Still has some palpitations .  Lipids are reviewed and are great Recently had an episode of pancreatitis.  Liver enz are stable .  Has been on a low fat diet since that time  CT of the abd ( several weeks later) looked ok   Her biological father died in his 73s of colon Walks regularly .      March 26, 2020:  Faith [name] is seen today for follow-up of her mild coronary artery disease, history of chest pain with a coronary calcium score of 19.  She  has had some palpitations.  Her blood pressure has been elevated recently.  Pt is having some surgery in March,   Will need an ECG several months prior .  She is at low risk for her surgery .  Has no significant CAD.  Has mild coronary plaque.    Coronary calcium score of 19.   Sep 25, 2020:  Faith [name] is seen today for hx of CAD.  Coronary calcium score  Is 19. Feeling well,  No CP  Lots of allergy issues,  Throat congestion. She did not have her neck lift that she was planning last year.   Her allergy cough precluded her from getting the surgery .  Might eat a bit more salt than she should  Doing cardio exercise  Labs from her primary medical doctor reveals a total cholesterol of 161.  The HDL is 83.  LDL is 63.  Triglyceride level 74.  November 24, 2021 Faith [name] is seen for follow up of her CAD Coronary CTA fro Feb, 2021 showed:  RCA : normal LM: normal LAD:  mild calcified plaque, minimal stenosis LCx: normal   Coronary calcium score of 19. This was 71 percentile  for age and sex matched control.   Sep 23, 2022 Faith [name] is seen for follow up of her mild CAD Cor CTA in Feb, 2021 showed  Mild CAD of her mid LAD Otherwise, her coronaries were normal  CAC was 19  Her last LDL was 36.   She's had some neck issues from severe coughing  Has had esophageal dilatation      Past Medical History:  Diagnosis Date   Allergy    spring    Anemia    yrs ago    Arthritis    Family history of colon cancer in father    Gastric ulcer    GERD (gastroesophageal reflux disease)    Hyperlipidemia    Hypertension    MVP (mitral valve prolapse)    Nerve damage 2023   Osteopenia    Osteoporosis     Past Surgical History:  Procedure Laterality Date   BREAST EXCISIONAL BIOPSY Right 1986   BREAST REDUCTION SURGERY     CHOLECYSTECTOMY     COLONOSCOPY     GASTRIC BYPASS     KNEE ARTHROSCOPY     right   REDUCTION MAMMAPLASTY Bilateral 2001   SKIN CANCER EXCISION     TEMPOROMANDIBULAR  JOINT SURGERY     TONSILLECTOMY AND ADENOIDECTOMY     TUBAL LIGATION     UPPER GASTROINTESTINAL ENDOSCOPY      Current Medications: Current Meds  Medication Sig   AMBULATORY NON FORMULARY MEDICATION Probioslim Extra Strength Probiotic, fat burner and bloat Tae 2 tablet by mouth daily   amLODipine (NORVASC) 5 MG tablet Take 1 tablet (5 mg total) by mouth daily.   amLODipine-atorvastatin (CADUET) 5-10 MG tablet daily.   amoxicillin-clavulanate (AUGMENTIN) 875-125 MG tablet 2 (two) times daily.   AUVI-Q 0.3 MG/0.3ML SOAJ injection Inject 0.3 mg into the muscle as needed.   azelastine (ASTELIN) 0.1 % nasal spray    B Complex Vitamins (VITAMIN B COMPLEX PO) Take 1 tablet by mouth daily.   CALCIUM PO Take 1 tablet by mouth daily. Adult chewable calcium supplement   denosumab (PROLIA) 60 MG/ML SOSY injection as directed. Once per year   dicyclomine (BENTYL) 20 MG tablet Take 20 mg by mouth in the morning and at bedtime.   DULoxetine (CYMBALTA) 60 MG capsule Take 1 capsule (60 mg total) by mouth daily.   Ferrous Sulfate (IRON PO) Take 1 tablet by mouth daily.   levocetirizine (XYZAL) 5 MG tablet Take 1 tablet by mouth daily.   losartan (COZAAR) 100 MG tablet Take 100 mg by mouth daily.   montelukast (SINGULAIR) 10 MG tablet Take 10 mg by mouth at bedtime.   moxifloxacin (VIGAMOX) 0.5 % ophthalmic solution Place into the right eye 3 (three) times daily.   Multiple Vitamin (MULITIVITAMIN WITH MINERALS) TABS Take 3 tablets by mouth daily. Adult gummy multi-vitamins   omeprazole (PRILOSEC) 20 MG capsule Take 20 mg by mouth daily.   Probiotic Product (PROBIOTIC DAILY PO) Take 1 capsule daily by mouth.   rosuvastatin (CRESTOR) 20 MG tablet Take 1 tablet (20 mg total) by mouth daily.   traZODone (DESYREL) 50 MG tablet Take 1 tablet (50 mg total) by mouth at bedtime as needed for sleep.   tretinoin (RETIN-A) 0.1 % cream SMARTSIG:Topical Every Evening   VITAMIN D PO Take 1 tablet by mouth daily.      Allergies:   Patient has no known allergies.   Social History   Socioeconomic History   Marital status: Married  Spouse name: Not on file   Number of children: 2   Years of education: Not on file   Highest education level: Not on file  Occupational History   Occupation: Mental Health Therapist  Tobacco Use   Smoking status: Never   Smokeless tobacco: Never  Vaping Use   Vaping Use: Never used  Substance and Sexual Activity   Alcohol use: Yes    Alcohol/week: 0.0 standard drinks of alcohol    Comment: rare   Drug use: No   Sexual activity: Not on file  Other Topics Concern   Not on file  Social History Narrative   Not on file   Social Determinants of Health   Financial Resource Strain: Not on file  Food Insecurity: Not on file  Transportation Needs: Not on file  Physical Activity: Not on file  Stress: Not on file  Social Connections: Not on file     Family History: The patient's family history includes Colon cancer (age of onset: 21) in her father. There is no history of Stomach cancer, Esophageal cancer, or Pancreatic cancer. She was adopted.  ROS:   Please see the history of present illness.     All other systems reviewed and are negative.  EKGs/Labs/Other Studies Reviewed:    The following studies were reviewed today:   EKG:     Recent Labs: 02/18/2022: ALT 16 08/13/2022: BUN 15; Creatinine, Ser 0.61; Hemoglobin 15.2; Platelets 191; Potassium 3.9; Sodium 145; TSH 1.310  Recent Lipid Panel    Component Value Date/Time   CHOL 136 02/18/2022 0903   TRIG 48 02/18/2022 0903   HDL 89 02/18/2022 0903   CHOLHDL 1.5 02/18/2022 0903   LDLCALC 36 02/18/2022 0903    Physical Exam:     Physical Exam: Blood pressure 120/70, pulse 61, height 5\' 3"  (1.6 m), weight 163 lb (73.9 kg), SpO2 96 %.       GEN:  Well nourished, well developed in no acute distress HEENT: Normal NECK: No JVD; No carotid bruits LYMPHATICS: No lymphadenopathy CARDIAC: RRR , no  murmurs, rubs, gallops RESPIRATORY:  Clear to auscultation without rales, wheezing or rhonchi  ABDOMEN: Soft, non-tender, non-distended MUSCULOSKELETAL:  No edema; No deformity  SKIN: Warm and dry NEUROLOGIC:  Alert and oriented x 3     ASSESSMENT:    No diagnosis found.  PLAN:     1.  Chest pain :      no further episodes of CP    2.  Hyperlipidemia: LDL is 36 . Great . Cont meds.   3.  Recent episode of pancreatitis:    4.  Hypertension:   BP is well controlled.      Medication Adjustments/Labs and Tests Ordered: Current medicines are reviewed at length with the patient today.  Concerns regarding medicines are outlined above.  No orders of the defined types were placed in this encounter.  No orders of the defined types were placed in this encounter.   Patient Instructions  Medication Instructions:  Your physician recommends that you continue on your current medications as directed. Please refer to the Current Medication list given to you today.  *If you need a refill on your cardiac medications before your next appointment, please call your pharmacy*   Lab Work: NONE If you have labs (blood work) drawn today and your tests are completely normal, you will receive your results only by: MyChart Message (if you have MyChart) OR A paper copy in the mail If you have any lab  test that is abnormal or we need to change your treatment, we will call you to review the results.   Testing/Procedures: NONE   Follow-Up: At [facility], you and your health needs are our priority.  As part of our continuing mission to provide you with exceptional heart care, we have created designated Provider Care Teams.  These Care Teams include your primary Cardiologist (physician) and Advanced Practice Providers (APPs -  Physician Assistants and Nurse Practitioners) who all work together to provide you with the care you need, when you need it.  We recommend signing up for the  patient portal called "MyChart".  Sign up information is provided on this After Visit Summary.  MyChart is used to connect with patients for Virtual Visits (Telemedicine).  Patients are able to view lab/test results, encounter notes, upcoming appointments, etc.  Non-urgent messages can be sent to your provider as well.   To learn more about what you can do with MyChart, go to [email or web address].    Your next appointment:   6 month(s)  Provider:   Kristeen Miss, MD        Signed, Kristeen Miss, MD  09/24/2022 9:17 AM    [facility]

## 2022-09-23 ENCOUNTER — Ambulatory Visit: Payer: BC Managed Care – PPO | Attending: Cardiovascular Disease | Admitting: Cardiovascular Disease

## 2022-09-23 ENCOUNTER — Encounter: Payer: Self-pay | Admitting: Cardiovascular Disease

## 2022-09-23 VITALS — BP 120/70 | HR 61 | Ht 63.0 in | Wt 163.0 lb

## 2022-09-23 DIAGNOSIS — R0789 Other chest pain: Secondary | ICD-10-CM

## 2022-09-23 DIAGNOSIS — I2510 Atherosclerotic heart disease of native coronary artery without angina pectoris: Secondary | ICD-10-CM | POA: Diagnosis not present

## 2022-09-23 DIAGNOSIS — I2584 Coronary atherosclerosis due to calcified coronary lesion: Secondary | ICD-10-CM | POA: Diagnosis not present

## 2022-09-23 DIAGNOSIS — I10 Essential (primary) hypertension: Secondary | ICD-10-CM | POA: Diagnosis not present

## 2022-09-23 NOTE — Patient Instructions (Signed)
Medication Instructions:  Your physician recommends that you continue on your current medications as directed. Please refer to the Current Medication list given to you today.  *If you need a refill on your cardiac medications before your next appointment, please call your pharmacy*   Lab Work: NONE If you have labs (blood work) drawn today and your tests are completely normal, you will receive your results only by: MyChart Message (if you have MyChart) OR A paper copy in the mail If you have any lab test that is abnormal or we need to change your treatment, we will call you to review the results.   Testing/Procedures: NONE   Follow-Up: At [facility], you and your health needs are our priority.  As part of our continuing mission to provide you with exceptional heart care, we have created designated Provider Care Teams.  These Care Teams include your primary Cardiologist (physician) and Advanced Practice Providers (APPs -  Physician Assistants and Nurse Practitioners) who all work together to provide you with the care you need, when you need it.  We recommend signing up for the patient portal called "MyChart".  Sign up information is provided on this After Visit Summary.  MyChart is used to connect with patients for Virtual Visits (Telemedicine).  Patients are able to view lab/test results, encounter notes, upcoming appointments, etc.  Non-urgent messages can be sent to your provider as well.   To learn more about what you can do with MyChart, go to [email or web address].    Your next appointment:   6 month(s)  Provider:   Philip Nahser, MD      

## 2022-10-01 DIAGNOSIS — J301 Allergic rhinitis due to pollen: Secondary | ICD-10-CM | POA: Diagnosis not present

## 2022-10-01 DIAGNOSIS — J3089 Other allergic rhinitis: Secondary | ICD-10-CM | POA: Diagnosis not present

## 2022-10-06 DIAGNOSIS — L0109 Other impetigo: Secondary | ICD-10-CM | POA: Diagnosis not present

## 2022-10-06 DIAGNOSIS — D2271 Melanocytic nevi of right lower limb, including hip: Secondary | ICD-10-CM | POA: Diagnosis not present

## 2022-10-06 DIAGNOSIS — L821 Other seborrheic keratosis: Secondary | ICD-10-CM | POA: Diagnosis not present

## 2022-10-06 DIAGNOSIS — Z85820 Personal history of malignant melanoma of skin: Secondary | ICD-10-CM | POA: Diagnosis not present

## 2022-10-06 DIAGNOSIS — D485 Neoplasm of uncertain behavior of skin: Secondary | ICD-10-CM | POA: Diagnosis not present

## 2022-10-06 DIAGNOSIS — L738 Other specified follicular disorders: Secondary | ICD-10-CM | POA: Diagnosis not present

## 2022-10-14 ENCOUNTER — Ambulatory Visit: Payer: BC Managed Care – PPO | Admitting: Gastroenterology

## 2022-10-20 DIAGNOSIS — J3089 Other allergic rhinitis: Secondary | ICD-10-CM | POA: Diagnosis not present

## 2022-10-20 DIAGNOSIS — J3081 Allergic rhinitis due to animal (cat) (dog) hair and dander: Secondary | ICD-10-CM | POA: Diagnosis not present

## 2022-10-20 DIAGNOSIS — J301 Allergic rhinitis due to pollen: Secondary | ICD-10-CM | POA: Diagnosis not present

## 2022-10-27 DIAGNOSIS — J301 Allergic rhinitis due to pollen: Secondary | ICD-10-CM | POA: Diagnosis not present

## 2022-10-27 DIAGNOSIS — J3089 Other allergic rhinitis: Secondary | ICD-10-CM | POA: Diagnosis not present

## 2022-11-11 DIAGNOSIS — J3089 Other allergic rhinitis: Secondary | ICD-10-CM | POA: Diagnosis not present

## 2022-11-11 DIAGNOSIS — J301 Allergic rhinitis due to pollen: Secondary | ICD-10-CM | POA: Diagnosis not present

## 2022-11-11 DIAGNOSIS — J3081 Allergic rhinitis due to animal (cat) (dog) hair and dander: Secondary | ICD-10-CM | POA: Diagnosis not present

## 2022-11-18 DIAGNOSIS — J301 Allergic rhinitis due to pollen: Secondary | ICD-10-CM | POA: Diagnosis not present

## 2022-11-18 DIAGNOSIS — J3081 Allergic rhinitis due to animal (cat) (dog) hair and dander: Secondary | ICD-10-CM | POA: Diagnosis not present

## 2022-11-18 DIAGNOSIS — J3089 Other allergic rhinitis: Secondary | ICD-10-CM | POA: Diagnosis not present

## 2022-11-25 DIAGNOSIS — J3089 Other allergic rhinitis: Secondary | ICD-10-CM | POA: Diagnosis not present

## 2022-11-25 DIAGNOSIS — J301 Allergic rhinitis due to pollen: Secondary | ICD-10-CM | POA: Diagnosis not present

## 2022-12-01 ENCOUNTER — Other Ambulatory Visit: Payer: Self-pay | Admitting: Cardiovascular Disease

## 2022-12-02 DIAGNOSIS — J301 Allergic rhinitis due to pollen: Secondary | ICD-10-CM | POA: Diagnosis not present

## 2022-12-02 DIAGNOSIS — J3081 Allergic rhinitis due to animal (cat) (dog) hair and dander: Secondary | ICD-10-CM | POA: Diagnosis not present

## 2022-12-02 DIAGNOSIS — J3089 Other allergic rhinitis: Secondary | ICD-10-CM | POA: Diagnosis not present

## 2022-12-08 DIAGNOSIS — L988 Other specified disorders of the skin and subcutaneous tissue: Secondary | ICD-10-CM | POA: Diagnosis not present

## 2022-12-08 DIAGNOSIS — D485 Neoplasm of uncertain behavior of skin: Secondary | ICD-10-CM | POA: Diagnosis not present

## 2022-12-15 DIAGNOSIS — J301 Allergic rhinitis due to pollen: Secondary | ICD-10-CM | POA: Diagnosis not present

## 2022-12-15 DIAGNOSIS — J3089 Other allergic rhinitis: Secondary | ICD-10-CM | POA: Diagnosis not present

## 2022-12-15 DIAGNOSIS — J3081 Allergic rhinitis due to animal (cat) (dog) hair and dander: Secondary | ICD-10-CM | POA: Diagnosis not present

## 2023-01-01 DIAGNOSIS — J3081 Allergic rhinitis due to animal (cat) (dog) hair and dander: Secondary | ICD-10-CM | POA: Diagnosis not present

## 2023-01-01 DIAGNOSIS — J301 Allergic rhinitis due to pollen: Secondary | ICD-10-CM | POA: Diagnosis not present

## 2023-01-01 DIAGNOSIS — J3089 Other allergic rhinitis: Secondary | ICD-10-CM | POA: Diagnosis not present

## 2023-01-06 DIAGNOSIS — E785 Hyperlipidemia, unspecified: Secondary | ICD-10-CM | POA: Diagnosis not present

## 2023-01-06 DIAGNOSIS — R7989 Other specified abnormal findings of blood chemistry: Secondary | ICD-10-CM | POA: Diagnosis not present

## 2023-01-13 DIAGNOSIS — J301 Allergic rhinitis due to pollen: Secondary | ICD-10-CM | POA: Diagnosis not present

## 2023-01-13 DIAGNOSIS — J3081 Allergic rhinitis due to animal (cat) (dog) hair and dander: Secondary | ICD-10-CM | POA: Diagnosis not present

## 2023-01-13 DIAGNOSIS — J3089 Other allergic rhinitis: Secondary | ICD-10-CM | POA: Diagnosis not present

## 2023-01-13 DIAGNOSIS — H903 Sensorineural hearing loss, bilateral: Secondary | ICD-10-CM | POA: Diagnosis not present

## 2023-01-22 DIAGNOSIS — Z23 Encounter for immunization: Secondary | ICD-10-CM | POA: Diagnosis not present

## 2023-01-22 DIAGNOSIS — M25559 Pain in unspecified hip: Secondary | ICD-10-CM | POA: Diagnosis not present

## 2023-01-22 DIAGNOSIS — Z1339 Encounter for screening examination for other mental health and behavioral disorders: Secondary | ICD-10-CM | POA: Diagnosis not present

## 2023-01-22 DIAGNOSIS — Z0000 Encounter for general adult medical examination without abnormal findings: Secondary | ICD-10-CM | POA: Diagnosis not present

## 2023-01-22 DIAGNOSIS — Z1331 Encounter for screening for depression: Secondary | ICD-10-CM | POA: Diagnosis not present

## 2023-01-25 DIAGNOSIS — J3081 Allergic rhinitis due to animal (cat) (dog) hair and dander: Secondary | ICD-10-CM | POA: Diagnosis not present

## 2023-01-25 DIAGNOSIS — J3089 Other allergic rhinitis: Secondary | ICD-10-CM | POA: Diagnosis not present

## 2023-01-25 DIAGNOSIS — J301 Allergic rhinitis due to pollen: Secondary | ICD-10-CM | POA: Diagnosis not present

## 2023-02-08 DIAGNOSIS — J301 Allergic rhinitis due to pollen: Secondary | ICD-10-CM | POA: Diagnosis not present

## 2023-02-08 DIAGNOSIS — J3089 Other allergic rhinitis: Secondary | ICD-10-CM | POA: Diagnosis not present

## 2023-02-08 DIAGNOSIS — J3081 Allergic rhinitis due to animal (cat) (dog) hair and dander: Secondary | ICD-10-CM | POA: Diagnosis not present

## 2023-02-25 DIAGNOSIS — J3081 Allergic rhinitis due to animal (cat) (dog) hair and dander: Secondary | ICD-10-CM | POA: Diagnosis not present

## 2023-02-25 DIAGNOSIS — J301 Allergic rhinitis due to pollen: Secondary | ICD-10-CM | POA: Diagnosis not present

## 2023-02-25 DIAGNOSIS — R052 Subacute cough: Secondary | ICD-10-CM | POA: Diagnosis not present

## 2023-02-25 DIAGNOSIS — K2100 Gastro-esophageal reflux disease with esophagitis, without bleeding: Secondary | ICD-10-CM | POA: Diagnosis not present

## 2023-02-25 DIAGNOSIS — J3089 Other allergic rhinitis: Secondary | ICD-10-CM | POA: Diagnosis not present

## 2023-03-06 DIAGNOSIS — Z23 Encounter for immunization: Secondary | ICD-10-CM | POA: Diagnosis not present

## 2023-03-08 DIAGNOSIS — J3089 Other allergic rhinitis: Secondary | ICD-10-CM | POA: Diagnosis not present

## 2023-03-08 DIAGNOSIS — J3081 Allergic rhinitis due to animal (cat) (dog) hair and dander: Secondary | ICD-10-CM | POA: Diagnosis not present

## 2023-03-08 DIAGNOSIS — J301 Allergic rhinitis due to pollen: Secondary | ICD-10-CM | POA: Diagnosis not present

## 2023-03-26 ENCOUNTER — Ambulatory Visit: Payer: BC Managed Care – PPO | Admitting: Cardiovascular Disease

## 2023-03-26 ENCOUNTER — Other Ambulatory Visit (HOSPITAL_COMMUNITY): Payer: Self-pay

## 2023-03-26 DIAGNOSIS — J3089 Other allergic rhinitis: Secondary | ICD-10-CM | POA: Diagnosis not present

## 2023-03-26 DIAGNOSIS — J3081 Allergic rhinitis due to animal (cat) (dog) hair and dander: Secondary | ICD-10-CM | POA: Diagnosis not present

## 2023-03-26 DIAGNOSIS — J301 Allergic rhinitis due to pollen: Secondary | ICD-10-CM | POA: Diagnosis not present

## 2023-03-29 ENCOUNTER — Other Ambulatory Visit: Payer: Self-pay | Admitting: Internal Medicine

## 2023-03-29 DIAGNOSIS — Z1231 Encounter for screening mammogram for malignant neoplasm of breast: Secondary | ICD-10-CM

## 2023-03-29 DIAGNOSIS — H5203 Hypermetropia, bilateral: Secondary | ICD-10-CM | POA: Diagnosis not present

## 2023-03-30 ENCOUNTER — Ambulatory Visit (HOSPITAL_COMMUNITY)
Admission: RE | Admit: 2023-03-30 | Discharge: 2023-03-30 | Disposition: A | Discharge status: Home / Self Care | Payer: BC Managed Care – PPO | Source: Ambulatory Visit | Attending: Internal Medicine | Admitting: Internal Medicine

## 2023-03-30 DIAGNOSIS — M810 Age-related osteoporosis without current pathological fracture: Secondary | ICD-10-CM | POA: Diagnosis not present

## 2023-03-30 MED ORDER — DENOSUMAB 60 MG/ML ~~LOC~~ SOSY
60.0000 mg | PREFILLED_SYRINGE | Freq: Once | SUBCUTANEOUS | Status: AC
  Administered 2023-03-30: 60 mg via SUBCUTANEOUS

## 2023-03-30 MED ORDER — DENOSUMAB 60 MG/ML ~~LOC~~ SOSY
PREFILLED_SYRINGE | SUBCUTANEOUS | Status: AC
  Filled 2023-03-30: qty 1

## 2023-04-06 DIAGNOSIS — J3089 Other allergic rhinitis: Secondary | ICD-10-CM | POA: Diagnosis not present

## 2023-04-06 DIAGNOSIS — J3081 Allergic rhinitis due to animal (cat) (dog) hair and dander: Secondary | ICD-10-CM | POA: Diagnosis not present

## 2023-04-06 DIAGNOSIS — J301 Allergic rhinitis due to pollen: Secondary | ICD-10-CM | POA: Diagnosis not present

## 2023-04-19 DIAGNOSIS — H2512 Age-related nuclear cataract, left eye: Secondary | ICD-10-CM | POA: Diagnosis not present

## 2023-04-20 DIAGNOSIS — J301 Allergic rhinitis due to pollen: Secondary | ICD-10-CM | POA: Diagnosis not present

## 2023-04-20 DIAGNOSIS — J3089 Other allergic rhinitis: Secondary | ICD-10-CM | POA: Diagnosis not present

## 2023-04-21 DIAGNOSIS — J301 Allergic rhinitis due to pollen: Secondary | ICD-10-CM | POA: Diagnosis not present

## 2023-04-22 DIAGNOSIS — J3089 Other allergic rhinitis: Secondary | ICD-10-CM | POA: Diagnosis not present

## 2023-04-26 DIAGNOSIS — Z01419 Encounter for gynecological examination (general) (routine) without abnormal findings: Secondary | ICD-10-CM | POA: Diagnosis not present

## 2023-04-26 DIAGNOSIS — Z124 Encounter for screening for malignant neoplasm of cervix: Secondary | ICD-10-CM | POA: Diagnosis not present

## 2023-04-26 DIAGNOSIS — Z1331 Encounter for screening for depression: Secondary | ICD-10-CM | POA: Diagnosis not present

## 2023-04-29 ENCOUNTER — Other Ambulatory Visit: Payer: Self-pay | Admitting: Medical Genetics

## 2023-04-29 DIAGNOSIS — H2512 Age-related nuclear cataract, left eye: Secondary | ICD-10-CM | POA: Diagnosis not present

## 2023-04-29 DIAGNOSIS — G4733 Obstructive sleep apnea (adult) (pediatric): Secondary | ICD-10-CM | POA: Diagnosis not present

## 2023-05-03 ENCOUNTER — Ambulatory Visit
Admission: RE | Admit: 2023-05-03 | Discharge: 2023-05-03 | Disposition: A | Discharge status: Home / Self Care | Payer: BC Managed Care – PPO | Source: Ambulatory Visit | Attending: Internal Medicine

## 2023-05-03 DIAGNOSIS — Z1231 Encounter for screening mammogram for malignant neoplasm of breast: Secondary | ICD-10-CM

## 2023-05-06 DIAGNOSIS — H2511 Age-related nuclear cataract, right eye: Secondary | ICD-10-CM | POA: Diagnosis not present

## 2023-05-06 DIAGNOSIS — H25811 Combined forms of age-related cataract, right eye: Secondary | ICD-10-CM | POA: Diagnosis not present

## 2023-05-06 DIAGNOSIS — G4733 Obstructive sleep apnea (adult) (pediatric): Secondary | ICD-10-CM | POA: Diagnosis not present

## 2023-05-24 ENCOUNTER — Other Ambulatory Visit (HOSPITAL_COMMUNITY): Payer: Self-pay | Attending: Medical Genetics

## 2023-05-27 DIAGNOSIS — J3081 Allergic rhinitis due to animal (cat) (dog) hair and dander: Secondary | ICD-10-CM | POA: Diagnosis not present

## 2023-05-27 DIAGNOSIS — J3089 Other allergic rhinitis: Secondary | ICD-10-CM | POA: Diagnosis not present

## 2023-05-27 DIAGNOSIS — J301 Allergic rhinitis due to pollen: Secondary | ICD-10-CM | POA: Diagnosis not present

## 2023-06-07 DIAGNOSIS — J3089 Other allergic rhinitis: Secondary | ICD-10-CM | POA: Diagnosis not present

## 2023-06-07 DIAGNOSIS — J3081 Allergic rhinitis due to animal (cat) (dog) hair and dander: Secondary | ICD-10-CM | POA: Diagnosis not present

## 2023-06-07 DIAGNOSIS — J301 Allergic rhinitis due to pollen: Secondary | ICD-10-CM | POA: Diagnosis not present

## 2023-06-23 ENCOUNTER — Other Ambulatory Visit (HOSPITAL_COMMUNITY)
Admission: RE | Admit: 2023-06-23 | Discharge: 2023-06-23 | Disposition: A | Discharge status: Home / Self Care | Payer: Self-pay | Source: Ambulatory Visit | Attending: Medical Genetics | Admitting: Medical Genetics

## 2023-06-24 DIAGNOSIS — H02423 Myogenic ptosis of bilateral eyelids: Secondary | ICD-10-CM | POA: Diagnosis not present

## 2023-06-24 DIAGNOSIS — H0279 Other degenerative disorders of eyelid and periocular area: Secondary | ICD-10-CM | POA: Diagnosis not present

## 2023-06-24 DIAGNOSIS — H02831 Dermatochalasis of right upper eyelid: Secondary | ICD-10-CM | POA: Diagnosis not present

## 2023-06-24 DIAGNOSIS — J301 Allergic rhinitis due to pollen: Secondary | ICD-10-CM | POA: Diagnosis not present

## 2023-06-24 DIAGNOSIS — J3081 Allergic rhinitis due to animal (cat) (dog) hair and dander: Secondary | ICD-10-CM | POA: Diagnosis not present

## 2023-06-24 DIAGNOSIS — H02834 Dermatochalasis of left upper eyelid: Secondary | ICD-10-CM | POA: Diagnosis not present

## 2023-06-24 DIAGNOSIS — J3089 Other allergic rhinitis: Secondary | ICD-10-CM | POA: Diagnosis not present

## 2023-07-05 DIAGNOSIS — H53483 Generalized contraction of visual field, bilateral: Secondary | ICD-10-CM | POA: Diagnosis not present

## 2023-07-06 LAB — GENECONNECT MOLECULAR SCREEN: Genetic Analysis Overall Interpretation: NEGATIVE

## 2023-07-09 DIAGNOSIS — J301 Allergic rhinitis due to pollen: Secondary | ICD-10-CM | POA: Diagnosis not present

## 2023-07-09 DIAGNOSIS — J3089 Other allergic rhinitis: Secondary | ICD-10-CM | POA: Diagnosis not present

## 2023-07-15 DIAGNOSIS — J3089 Other allergic rhinitis: Secondary | ICD-10-CM | POA: Diagnosis not present

## 2023-07-15 DIAGNOSIS — J301 Allergic rhinitis due to pollen: Secondary | ICD-10-CM | POA: Diagnosis not present

## 2023-07-20 DIAGNOSIS — E785 Hyperlipidemia, unspecified: Secondary | ICD-10-CM | POA: Diagnosis not present

## 2023-07-20 DIAGNOSIS — I10 Essential (primary) hypertension: Secondary | ICD-10-CM | POA: Diagnosis not present

## 2023-07-20 DIAGNOSIS — E669 Obesity, unspecified: Secondary | ICD-10-CM | POA: Diagnosis not present

## 2023-07-20 LAB — LAB REPORT - SCANNED: EGFR (Non-African Amer.): 123.4

## 2023-07-26 DIAGNOSIS — J3089 Other allergic rhinitis: Secondary | ICD-10-CM | POA: Diagnosis not present

## 2023-07-26 DIAGNOSIS — J301 Allergic rhinitis due to pollen: Secondary | ICD-10-CM | POA: Diagnosis not present

## 2023-07-26 DIAGNOSIS — J3081 Allergic rhinitis due to animal (cat) (dog) hair and dander: Secondary | ICD-10-CM | POA: Diagnosis not present

## 2023-08-02 DIAGNOSIS — J3081 Allergic rhinitis due to animal (cat) (dog) hair and dander: Secondary | ICD-10-CM | POA: Diagnosis not present

## 2023-08-02 DIAGNOSIS — J3089 Other allergic rhinitis: Secondary | ICD-10-CM | POA: Diagnosis not present

## 2023-08-02 DIAGNOSIS — J301 Allergic rhinitis due to pollen: Secondary | ICD-10-CM | POA: Diagnosis not present

## 2023-08-09 DIAGNOSIS — J3089 Other allergic rhinitis: Secondary | ICD-10-CM | POA: Diagnosis not present

## 2023-08-09 DIAGNOSIS — J301 Allergic rhinitis due to pollen: Secondary | ICD-10-CM | POA: Diagnosis not present

## 2023-08-09 DIAGNOSIS — J3081 Allergic rhinitis due to animal (cat) (dog) hair and dander: Secondary | ICD-10-CM | POA: Diagnosis not present

## 2023-08-11 DIAGNOSIS — M2062 Acquired deformities of toe(s), unspecified, left foot: Secondary | ICD-10-CM | POA: Diagnosis not present

## 2023-08-11 DIAGNOSIS — R202 Paresthesia of skin: Secondary | ICD-10-CM | POA: Diagnosis not present

## 2023-08-17 DIAGNOSIS — J301 Allergic rhinitis due to pollen: Secondary | ICD-10-CM | POA: Diagnosis not present

## 2023-08-17 DIAGNOSIS — J3089 Other allergic rhinitis: Secondary | ICD-10-CM | POA: Diagnosis not present

## 2023-08-17 DIAGNOSIS — J3081 Allergic rhinitis due to animal (cat) (dog) hair and dander: Secondary | ICD-10-CM | POA: Diagnosis not present

## 2023-08-25 DIAGNOSIS — J3081 Allergic rhinitis due to animal (cat) (dog) hair and dander: Secondary | ICD-10-CM | POA: Diagnosis not present

## 2023-08-25 DIAGNOSIS — J301 Allergic rhinitis due to pollen: Secondary | ICD-10-CM | POA: Diagnosis not present

## 2023-08-25 DIAGNOSIS — J3089 Other allergic rhinitis: Secondary | ICD-10-CM | POA: Diagnosis not present

## 2023-08-30 DIAGNOSIS — J301 Allergic rhinitis due to pollen: Secondary | ICD-10-CM | POA: Diagnosis not present

## 2023-08-30 DIAGNOSIS — J3089 Other allergic rhinitis: Secondary | ICD-10-CM | POA: Diagnosis not present

## 2023-08-30 DIAGNOSIS — J3081 Allergic rhinitis due to animal (cat) (dog) hair and dander: Secondary | ICD-10-CM | POA: Diagnosis not present

## 2023-09-01 ENCOUNTER — Ambulatory Visit (INDEPENDENT_AMBULATORY_CARE_PROVIDER_SITE_OTHER): Admitting: Podiatry

## 2023-09-01 ENCOUNTER — Other Ambulatory Visit: Payer: Self-pay | Admitting: Cardiovascular Disease

## 2023-09-01 DIAGNOSIS — Q6672 Congenital pes cavus, left foot: Secondary | ICD-10-CM | POA: Diagnosis not present

## 2023-09-01 DIAGNOSIS — Q6671 Congenital pes cavus, right foot: Secondary | ICD-10-CM

## 2023-09-01 DIAGNOSIS — M2042 Other hammer toe(s) (acquired), left foot: Secondary | ICD-10-CM

## 2023-09-01 DIAGNOSIS — Q667 Congenital pes cavus, unspecified foot: Secondary | ICD-10-CM

## 2023-09-01 NOTE — Progress Notes (Signed)
 Subjective:  Patient ID: Faith [name], female    DOB: [birth year],  MRN: [id number]  Chief Complaint  Patient presents with   Toe Pain    Left foot 2nd toe     68 y.o. female presents with the above complaint.  Patient presents with complaint of left second digit hammertoe contracture semiflexible in nature painful to touch is progressive gotten worse worse with ambulation is with pressure she wanted to get it evaluated she has not seen and was prior to seeing me pain scale 2 out of 10.  Dull achy in nature.  She wanted discuss treatment options for this.  She also does not wear any orthotics denies seeing anyone else prior to seeing me   Review of Systems: Negative except as noted in the HPI. Denies N/V/F/Ch.  Past Medical History:  Diagnosis Date   Allergy    spring    Anemia    yrs ago    Arthritis    Family history of colon cancer in father    Gastric ulcer    GERD (gastroesophageal reflux disease)    Hyperlipidemia    Hypertension    MVP (mitral valve prolapse)    Nerve damage 2023   Osteopenia    Osteoporosis     Current Outpatient Medications:    AMBULATORY NON FORMULARY MEDICATION, Probioslim Extra Strength Probiotic, fat burner and bloat Tae 2 tablet by mouth daily, Disp: , Rfl:    amLODipine  (NORVASC ) 5 MG tablet, Take 1 tablet (5 mg total) by mouth daily., Disp: 90 tablet, Rfl: 0   amLODipine -atorvastatin (CADUET) 5-10 MG tablet, daily., Disp: , Rfl:    amoxicillin-clavulanate (AUGMENTIN) 875-125 MG tablet, 2 (two) times daily., Disp: , Rfl:    AUVI-Q 0.3 MG/0.3ML SOAJ injection, Inject 0.3 mg into the muscle as needed., Disp: , Rfl:    azelastine (ASTELIN) 0.1 % nasal spray, , Disp: , Rfl:    B Complex Vitamins (VITAMIN B COMPLEX PO), Take 1 tablet by mouth daily., Disp: , Rfl:    CALCIUM  PO, Take 1 tablet by mouth daily. Adult chewable calcium  supplement, Disp: , Rfl:    denosumab  (PROLIA ) 60 MG/ML SOSY injection, as directed. Once per year, Disp: , Rfl:     dicyclomine  (BENTYL ) 20 MG tablet, Take 20 mg by mouth in the morning and at bedtime., Disp: , Rfl:    DULoxetine  (CYMBALTA ) 60 MG capsule, Take 1 capsule (60 mg total) by mouth daily., Disp: 30 capsule, Rfl: 5   Ferrous Sulfate (IRON PO), Take 1 tablet by mouth daily., Disp: , Rfl:    levocetirizine (XYZAL) 5 MG tablet, Take 1 tablet by mouth daily., Disp: , Rfl: 3   losartan  (COZAAR ) 100 MG tablet, Take 100 mg by mouth daily., Disp: , Rfl:    montelukast (SINGULAIR) 10 MG tablet, Take 10 mg by mouth at bedtime., Disp: , Rfl:    moxifloxacin (VIGAMOX) 0.5 % ophthalmic solution, Place into the right eye 3 (three) times daily., Disp: , Rfl:    Multiple Vitamin (MULITIVITAMIN WITH MINERALS) TABS, Take 3 tablets by mouth daily. Adult gummy multi-vitamins, Disp: , Rfl:    omeprazole  (PRILOSEC) 20 MG capsule, Take 20 mg by mouth daily., Disp: , Rfl:    Probiotic Product (PROBIOTIC DAILY PO), Take 1 capsule daily by mouth., Disp: , Rfl:    rosuvastatin  (CRESTOR ) 20 MG tablet, TAKE 1 TABLET(20 MG) BY MOUTH DAILY, Disp: 90 tablet, Rfl: 3   traZODone  (DESYREL ) 50 MG tablet, Take 1 tablet (50 mg  total) by mouth at bedtime as needed for sleep., Disp: 30 tablet, Rfl: 0   tretinoin (RETIN-A) 0.1 % cream, SMARTSIG:Topical Every Evening, Disp: , Rfl:    VITAMIN D PO, Take 1 tablet by mouth daily., Disp: , Rfl:   Social History   Tobacco Use  Smoking Status Never  Smokeless Tobacco Never    No Known Allergies Objective:  There were no vitals filed for this visit. There is no height or weight on file to calculate BMI. Constitutional Well developed. Well nourished.  Vascular Dorsalis pedis pulses palpable bilaterally. Posterior tibial pulses palpable bilaterally. Capillary refill normal to all digits.  No cyanosis or clubbing noted. Pedal hair growth normal.  Neurologic Normal speech. Oriented to person, place, and time. Epicritic sensation to light touch grossly present bilaterally.  Dermatologic  Nails well groomed and normal in appearance. No open wounds. No skin lesions.  Orthopedic: Gait examination shows pes cavus foot deformity.  Semiflexible left second digit hammertoe contracture noted.  Mild pain on palpation.   Radiographs: None Assessment:   1. Hammertoe of left foot   2. Pes cavus    Plan:  Patient was evaluated and treated and all questions answered.  Left second digit hammertoe contracture - All questions and concerns were discussed with the patient in extensive detail given the nature of the hammertoe contracture I discussed shoe gear modification orthotics management for now.  If it continues to bother her we will discuss surgical options  Pes cavus -I explained to patient the etiology of pes cavus and relationship with Planter fasciitis and various treatment options were discussed.  Given patient foot structure in the setting of Planter fasciitis I believe patient will benefit from custom-made orthotics to help control the hindfoot motion support the arch of the foot and take the stress away from plantar fascial.  Patient agrees with the plan like to proceed with orthotics -Patient was casted for orthotics   No follow-ups on file.

## 2023-09-07 DIAGNOSIS — J3089 Other allergic rhinitis: Secondary | ICD-10-CM | POA: Diagnosis not present

## 2023-09-07 DIAGNOSIS — J3081 Allergic rhinitis due to animal (cat) (dog) hair and dander: Secondary | ICD-10-CM | POA: Diagnosis not present

## 2023-09-07 DIAGNOSIS — J301 Allergic rhinitis due to pollen: Secondary | ICD-10-CM | POA: Diagnosis not present

## 2023-09-16 DIAGNOSIS — J301 Allergic rhinitis due to pollen: Secondary | ICD-10-CM | POA: Diagnosis not present

## 2023-09-16 DIAGNOSIS — J3089 Other allergic rhinitis: Secondary | ICD-10-CM | POA: Diagnosis not present

## 2023-09-23 DIAGNOSIS — J301 Allergic rhinitis due to pollen: Secondary | ICD-10-CM | POA: Diagnosis not present

## 2023-09-23 DIAGNOSIS — J3081 Allergic rhinitis due to animal (cat) (dog) hair and dander: Secondary | ICD-10-CM | POA: Diagnosis not present

## 2023-09-23 DIAGNOSIS — J3089 Other allergic rhinitis: Secondary | ICD-10-CM | POA: Diagnosis not present

## 2023-09-29 DIAGNOSIS — J301 Allergic rhinitis due to pollen: Secondary | ICD-10-CM | POA: Diagnosis not present

## 2023-09-29 DIAGNOSIS — J3089 Other allergic rhinitis: Secondary | ICD-10-CM | POA: Diagnosis not present

## 2023-10-06 ENCOUNTER — Telehealth: Payer: Self-pay

## 2023-10-06 NOTE — Telephone Encounter (Signed)
 Called to resched 6/9 appt;; pt will call back

## 2023-10-13 ENCOUNTER — Telehealth: Payer: Self-pay

## 2023-10-13 DIAGNOSIS — J301 Allergic rhinitis due to pollen: Secondary | ICD-10-CM | POA: Diagnosis not present

## 2023-10-13 DIAGNOSIS — J3089 Other allergic rhinitis: Secondary | ICD-10-CM | POA: Diagnosis not present

## 2023-10-13 DIAGNOSIS — J3081 Allergic rhinitis due to animal (cat) (dog) hair and dander: Secondary | ICD-10-CM | POA: Diagnosis not present

## 2023-10-13 NOTE — Telephone Encounter (Signed)
 Auth Submission: APPROVED Site of care: Site of care: [facility] Payer: BCBS Medication & CPT/J Code(s) submitted: Prolia  (Denosumab ) [id number] Route of submission (phone, fax, portal): phone Phone # [phone] Fax # Auth type: Buy/Bill PB Units/visits requested: 60mg  x 2 doses Reference number: [id number] Approval from: 09/15/23 to 09/13/24

## 2023-10-15 ENCOUNTER — Other Ambulatory Visit (HOSPITAL_COMMUNITY): Payer: Self-pay | Admitting: *Deleted

## 2023-10-15 DIAGNOSIS — G479 Sleep disorder, unspecified: Secondary | ICD-10-CM | POA: Diagnosis not present

## 2023-10-18 ENCOUNTER — Encounter (HOSPITAL_COMMUNITY): Payer: Self-pay

## 2023-10-18 ENCOUNTER — Encounter (HOSPITAL_COMMUNITY)

## 2023-10-18 DIAGNOSIS — Z85820 Personal history of malignant melanoma of skin: Secondary | ICD-10-CM | POA: Diagnosis not present

## 2023-10-18 DIAGNOSIS — D2261 Melanocytic nevi of right upper limb, including shoulder: Secondary | ICD-10-CM | POA: Diagnosis not present

## 2023-10-18 DIAGNOSIS — L821 Other seborrheic keratosis: Secondary | ICD-10-CM | POA: Diagnosis not present

## 2023-10-18 DIAGNOSIS — L570 Actinic keratosis: Secondary | ICD-10-CM | POA: Diagnosis not present

## 2023-10-18 DIAGNOSIS — D2262 Melanocytic nevi of left upper limb, including shoulder: Secondary | ICD-10-CM | POA: Diagnosis not present

## 2023-10-19 ENCOUNTER — Ambulatory Visit

## 2023-10-21 ENCOUNTER — Ambulatory Visit (HOSPITAL_COMMUNITY)
Admission: RE | Admit: 2023-10-21 | Discharge: 2023-10-21 | Disposition: A | Discharge status: Home / Self Care | Source: Ambulatory Visit | Attending: Internal Medicine | Admitting: Internal Medicine

## 2023-10-21 DIAGNOSIS — M810 Age-related osteoporosis without current pathological fracture: Secondary | ICD-10-CM | POA: Diagnosis not present

## 2023-10-21 MED ORDER — DENOSUMAB 60 MG/ML ~~LOC~~ SOSY
60.0000 mg | PREFILLED_SYRINGE | Freq: Once | SUBCUTANEOUS | Status: AC
  Administered 2023-10-21: 60 mg via SUBCUTANEOUS

## 2023-10-21 MED ORDER — DENOSUMAB 60 MG/ML ~~LOC~~ SOSY
PREFILLED_SYRINGE | SUBCUTANEOUS | Status: AC
  Filled 2023-10-21: qty 1

## 2023-10-22 DIAGNOSIS — J3089 Other allergic rhinitis: Secondary | ICD-10-CM | POA: Diagnosis not present

## 2023-10-22 DIAGNOSIS — J3081 Allergic rhinitis due to animal (cat) (dog) hair and dander: Secondary | ICD-10-CM | POA: Diagnosis not present

## 2023-10-22 DIAGNOSIS — J301 Allergic rhinitis due to pollen: Secondary | ICD-10-CM | POA: Diagnosis not present

## 2023-10-25 ENCOUNTER — Other Ambulatory Visit

## 2023-10-29 ENCOUNTER — Other Ambulatory Visit: Payer: Self-pay | Admitting: Cardiovascular Disease

## 2023-11-01 NOTE — Progress Notes (Signed)
 Patient presents today to pick up custom molded foot orthotics, diagnosed with Hammertoes by Dr. Lydia Sams.   Orthotics were dispensed and fit was satisfactory. Reviewed instructions for break-in and wear. Written instructions given to patient.  Patient will follow up as needed.   Britton Cane Cped, CFo, CFm

## 2023-11-05 ENCOUNTER — Other Ambulatory Visit: Payer: Self-pay | Admitting: Cardiovascular Disease

## 2023-11-10 DIAGNOSIS — J3089 Other allergic rhinitis: Secondary | ICD-10-CM | POA: Diagnosis not present

## 2023-11-10 DIAGNOSIS — J301 Allergic rhinitis due to pollen: Secondary | ICD-10-CM | POA: Diagnosis not present

## 2023-11-10 DIAGNOSIS — J3081 Allergic rhinitis due to animal (cat) (dog) hair and dander: Secondary | ICD-10-CM | POA: Diagnosis not present

## 2023-12-15 ENCOUNTER — Other Ambulatory Visit: Payer: Self-pay

## 2023-12-15 MED ORDER — AMLODIPINE BESYLATE 5 MG PO TABS: 5.0000 mg | ORAL_TABLET | Freq: Every day | ORAL | 0 refills | Status: DC

## 2023-12-30 ENCOUNTER — Other Ambulatory Visit: Payer: Self-pay | Admitting: Cardiovascular Disease

## 2024-01-15 ENCOUNTER — Other Ambulatory Visit: Payer: Self-pay | Admitting: Cardiovascular Disease

## 2024-02-08 ENCOUNTER — Other Ambulatory Visit: Payer: Self-pay | Admitting: Physician Assistant

## 2024-02-09 ENCOUNTER — Telehealth: Payer: Self-pay | Admitting: Physician Assistant

## 2024-02-09 NOTE — Telephone Encounter (Signed)
 Too Soon for a refill

## 2024-02-09 NOTE — Telephone Encounter (Signed)
 *  STAT* If patient is at the pharmacy, call can be transferred to refill team.   1. Which medications need to be refilled? (please list name of each medication and dose if known) amLODipine  (NORVASC ) 5 MG tablet    2. Would you like to learn more about the convenience, safety, & potential cost savings by using the [facility]? No      3. Are you open to using the [facility] (Type [facility]. ). No    4. Which pharmacy/location (including street and city if local pharmacy) is medication to be sent to?  [id number] - [place], [place] - [address] AT [facility] [place] [address]     5. Do they need a 30 day or 90 day supply? 30 days    The patient said she is still waiting for her Medicare to be fixed and couldn't make an appointment at this time. 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 She will call back in 30 days to schedule an appointment.

## 2024-02-10 ENCOUNTER — Other Ambulatory Visit: Payer: Self-pay | Admitting: Physician Assistant

## 2024-02-11 ENCOUNTER — Other Ambulatory Visit: Payer: Self-pay | Admitting: Physician Assistant

## 2024-02-11 MED ORDER — AMLODIPINE BESYLATE 5 MG PO TABS: 5.0000 mg | ORAL_TABLET | Freq: Every day | ORAL | 1 refills | Status: DC

## 2024-03-13 NOTE — Progress Notes (Deleted)
  Cardiology Office Note   Date:  03/13/2024  ID:  Faith [name], DOB 03-Jan-1956, MRN [id number] PCP: Stephane Leita DEL, MD  [facility] Providers Cardiologist:  Aleene Passe, MD (Inactive) { Click to update primary MD,subspecialty MD or APP then REFRESH:1}    History of Present Illness Faith [name] is a 68 y.o. female ***  ROS: ***  Studies Reviewed      *** Risk Assessment/Calculations {Does this patient have ATRIAL FIBRILLATION?[phone]} No BP recorded.  {Refresh Note OR Click here to enter BP  :1}***       Physical Exam VS:  There were no vitals taken for this visit.       Wt Readings from Last 3 Encounters:  09/23/22 163 lb (73.9 kg)  08/13/22 163 lb 9.6 oz (74.2 kg)  07/07/22 166 lb (75.3 kg)    GEN: Well nourished, well developed in no acute distress NECK: No JVD; No carotid bruits CARDIAC: ***RRR, no murmurs, rubs, gallops RESPIRATORY:  Clear to auscultation without rales, wheezing or rhonchi  ABDOMEN: Soft, non-tender, non-distended EXTREMITIES:  No edema; No deformity   ASSESSMENT AND PLAN ***    {Are you ordering a CV Procedure (e.g. stress test, cath, DCCV, TEE, etc)?   Press F2        :[id number]}  Dispo: ***  Signed, Orren LOISE Fabry, PA-C

## 2024-03-16 ENCOUNTER — Ambulatory Visit: Admitting: Physician Assistant

## 2024-04-04 ENCOUNTER — Other Ambulatory Visit: Payer: Self-pay | Admitting: Physician Assistant

## 2024-04-05 ENCOUNTER — Ambulatory Visit (INDEPENDENT_AMBULATORY_CARE_PROVIDER_SITE_OTHER): Admitting: Podiatry

## 2024-04-05 DIAGNOSIS — M722 Plantar fascial fibromatosis: Secondary | ICD-10-CM | POA: Diagnosis not present

## 2024-04-05 DIAGNOSIS — M62461 Contracture of muscle, right lower leg: Secondary | ICD-10-CM

## 2024-04-05 NOTE — Patient Instructions (Signed)
 Plantar Fasciitis (Heel Spur Syndrome) with Rehab The plantar fascia is a fibrous, ligament-like, soft-tissue structure that spans the bottom of the foot. Plantar fasciitis is a condition that causes pain in the foot due to inflammation of the tissue. SYMPTOMS  Pain and tenderness on the underneath side of the foot. Pain that worsens with standing or walking. CAUSES  Plantar fasciitis is caused by irritation and injury to the plantar fascia on the underneath side of the foot. Common mechanisms of injury include: Direct trauma to bottom of the foot. Damage to a small nerve that runs under the foot where the main fascia attaches to the heel bone. Stress placed on the plantar fascia due to bone spurs. RISK INCREASES WITH:  Activities that place stress on the plantar fascia (running, jumping, pivoting, or cutting). Poor strength and flexibility. Improperly fitted shoes. Tight calf muscles. Flat feet. Failure to warm-up properly before activity. Obesity. PREVENTION Warm up and stretch properly before activity. Allow for adequate recovery between workouts. Maintain physical fitness: Strength, flexibility, and endurance. Cardiovascular fitness. Maintain a health body weight. Avoid stress on the plantar fascia. Wear properly fitted shoes, including arch supports for individuals who have flat feet.  PROGNOSIS  If treated properly, then the symptoms of plantar fasciitis usually resolve without surgery. However, occasionally surgery is necessary.  RELATED COMPLICATIONS  Recurrent symptoms that may result in a chronic condition. Problems of the lower back that are caused by compensating for the injury, such as limping. Pain or weakness of the foot during push-off following surgery. Chronic inflammation, scarring, and partial or complete fascia tear, occurring more often from repeated injections.  TREATMENT  Treatment initially involves the use of ice and medication to help reduce pain and  inflammation. The use of strengthening and stretching exercises may help reduce pain with activity, especially stretches of the Achilles tendon. These exercises may be performed at home or with a therapist. Your caregiver may recommend that you use heel cups of arch supports to help reduce stress on the plantar fascia. Occasionally, corticosteroid injections are given to reduce inflammation. If symptoms persist for greater than 6 months despite non-surgical (conservative), then surgery may be recommended.   MEDICATION  If pain medication is necessary, then nonsteroidal anti-inflammatory medications, such as aspirin and ibuprofen, or other minor pain relievers, such as acetaminophen, are often recommended. Do not take pain medication within 7 days before surgery. Prescription pain relievers may be given if deemed necessary by your caregiver. Use only as directed and only as much as you need. Corticosteroid injections may be given by your caregiver. These injections should be reserved for the most serious cases, because they may only be given a certain number of times.  HEAT AND COLD Cold treatment (icing) relieves pain and reduces inflammation. Cold treatment should be applied for 10 to 15 minutes every 2 to 3 hours for inflammation and pain and immediately after any activity that aggravates your symptoms. Use ice packs or massage the area with a piece of ice (ice massage). Heat treatment may be used prior to performing the stretching and strengthening activities prescribed by your caregiver, physical therapist, or athletic trainer. Use a heat pack or soak the injury in warm water.  SEEK IMMEDIATE MEDICAL CARE IF: Treatment seems to offer no benefit, or the condition worsens. Any medications produce adverse side effects.  EXERCISES- RANGE OF MOTION (ROM) AND STRETCHING EXERCISES - Plantar Fasciitis (Heel Spur Syndrome) These exercises may help you when beginning to rehabilitate your injury. Your  symptoms may resolve with or without further involvement from your physician, physical therapist or athletic trainer. While completing these exercises, remember:  Restoring tissue flexibility helps normal motion to return to the joints. This allows healthier, less painful movement and activity. An effective stretch should be held for at least 30 seconds. A stretch should never be painful. You should only feel a gentle lengthening or release in the stretched tissue.  RANGE OF MOTION - Toe Extension, Flexion Sit with your right / left leg crossed over your opposite knee. Grasp your toes and gently pull them back toward the top of your foot. You should feel a stretch on the bottom of your toes and/or foot. Hold this stretch for 10 seconds. Now, gently pull your toes toward the bottom of your foot. You should feel a stretch on the top of your toes and or foot. Hold this stretch for 10 seconds. Repeat  times. Complete this stretch 3 times per day.   RANGE OF MOTION - Ankle Dorsiflexion, Active Assisted Remove shoes and sit on a chair that is preferably not on a carpeted surface. Place right / left foot under knee. Extend your opposite leg for support. Keeping your heel down, slide your right / left foot back toward the chair until you feel a stretch at your ankle or calf. If you do not feel a stretch, slide your bottom forward to the edge of the chair, while still keeping your heel down. Hold this stretch for 10 seconds. Repeat 3 times. Complete this stretch 2 times per day.   STRETCH  Gastroc, Standing Place hands on wall. Extend right / left leg, keeping the front knee somewhat bent. Slightly point your toes inward on your back foot. Keeping your right / left heel on the floor and your knee straight, shift your weight toward the wall, not allowing your back to arch. You should feel a gentle stretch in the right / left calf. Hold this position for 10 seconds. Repeat 3 times. Complete this  stretch 2 times per day.  STRETCH  Soleus, Standing Place hands on wall. Extend right / left leg, keeping the other knee somewhat bent. Slightly point your toes inward on your back foot. Keep your right / left heel on the floor, bend your back knee, and slightly shift your weight over the back leg so that you feel a gentle stretch deep in your back calf. Hold this position for 10 seconds. Repeat 3 times. Complete this stretch 2 times per day.  STRETCH  Gastrocsoleus, Standing  Note: This exercise can place a lot of stress on your foot and ankle. Please complete this exercise only if specifically instructed by your caregiver.  Place the ball of your right / left foot on a step, keeping your other foot firmly on the same step. Hold on to the wall or a rail for balance. Slowly lift your other foot, allowing your body weight to press your heel down over the edge of the step. You should feel a stretch in your right / left calf. Hold this position for 10 seconds. Repeat this exercise with a slight bend in your right / left knee. Repeat 3 times. Complete this stretch 2 times per day.   STRENGTHENING EXERCISES - Plantar Fasciitis (Heel Spur Syndrome)  These exercises may help you when beginning to rehabilitate your injury. They may resolve your symptoms with or without further involvement from your physician, physical therapist or athletic trainer. While completing these exercises, remember:  Muscles can  gain both the endurance and the strength needed for everyday activities through controlled exercises. Complete these exercises as instructed by your physician, physical therapist or athletic trainer. Progress the resistance and repetitions only as guided.  STRENGTH - Towel Curls Sit in a chair positioned on a non-carpeted surface. Place your foot on a towel, keeping your heel on the floor. Pull the towel toward your heel by only curling your toes. Keep your heel on the floor. Repeat 3 times.  Complete this exercise 2 times per day.  STRENGTH - Ankle Inversion Secure one end of a rubber exercise band/tubing to a fixed object (table, pole). Loop the other end around your foot just before your toes. Place your fists between your knees. This will focus your strengthening at your ankle. Slowly, pull your big toe up and in, making sure the band/tubing is positioned to resist the entire motion. Hold this position for 10 seconds. Have your muscles resist the band/tubing as it slowly pulls your foot back to the starting position. Repeat 3 times. Complete this exercises 2 times per day.  Document Released: 05/04/2005 Document Revised: 07/27/2011 Document Reviewed: 08/16/2008 [facility] Patient Information 2014 [place], [place].  While at your visit today you received a steroid injection in your foot or ankle to help with your pain. Along with having the steroid medication there is some "numbing" medication in the shot that you received. Due to this you may notice some numbness to the area for the next couple of hours.   I would recommend limiting activity for the next few days to help the steroid injection take affect.    The actually benefit from the steroid injection may take up to 2-7 days to see a difference. You may actually experience a small (as in 10%) INCREASE in pain in the first 24 hours---that is common. It would be best if you can ice the area today and take anti-inflammatory medications (such as Ibuprofen, Motrin, or Aleve) if you are able to take these medications. If you were prescribed another medication to help with the pain go ahead and start that medication today    Things to watch out for that you should contact us or a health care provider urgently would include: 1. Unusual (as in more than 10%) increase in pain 2. New fever > 101.5 3. New swelling or redness of the injected area.  4. Streaking of red lines around the area injected.  If you have any questions or concerns  about this, please give our office a call at [phone].

## 2024-04-05 NOTE — Progress Notes (Signed)
 Subjective:  Patient ID: Faith [name], female    DOB: 07-Apr-1956,  MRN: [id number]  Chief Complaint  Patient presents with   Foot Pain    Right heel pain     68 y.o. female presents with the above complaint.  Patient presents with complaint of right heel pain that has been off for quite some time is progressing and worse worse with ambulation and shoe pressure has not seen MRIs prior to seeing me denies any other acute complaints.  She states that it hurts with ambulation.  Pain scale 7 out of 10 dull aching nature.   Review of Systems: Negative except as noted in the HPI. Denies N/V/F/Ch.  Past Medical History:  Diagnosis Date   Allergy    spring    Anemia    yrs ago    Arthritis    Family history of colon cancer in father    Gastric ulcer    GERD (gastroesophageal reflux disease)    Hyperlipidemia    Hypertension    MVP (mitral valve prolapse)    Nerve damage 2023   Osteopenia    Osteoporosis     Current Outpatient Medications:    AMBULATORY NON FORMULARY MEDICATION, Probioslim Extra Strength Probiotic, fat burner and bloat Tae 2 tablet by mouth daily, Disp: , Rfl:    amLODipine  (NORVASC ) 5 MG tablet, TAKE 1 TABLET(5 MG) BY MOUTH DAILY, Disp: 90 tablet, Rfl: 0   amLODipine -atorvastatin (CADUET) 5-10 MG tablet, daily., Disp: , Rfl:    amoxicillin-clavulanate (AUGMENTIN) 875-125 MG tablet, 2 (two) times daily., Disp: , Rfl:    AUVI-Q 0.3 MG/0.3ML SOAJ injection, Inject 0.3 mg into the muscle as needed., Disp: , Rfl:    azelastine (ASTELIN) 0.1 % nasal spray, , Disp: , Rfl:    B Complex Vitamins (VITAMIN B COMPLEX PO), Take 1 tablet by mouth daily., Disp: , Rfl:    CALCIUM  PO, Take 1 tablet by mouth daily. Adult chewable calcium  supplement, Disp: , Rfl:    denosumab  (PROLIA ) 60 MG/ML SOSY injection, as directed. Once per year, Disp: , Rfl:    dicyclomine  (BENTYL ) 20 MG tablet, Take 20 mg by mouth in the morning and at bedtime., Disp: , Rfl:    DULoxetine  (CYMBALTA ) 60  MG capsule, Take 1 capsule (60 mg total) by mouth daily., Disp: 30 capsule, Rfl: 5   Ferrous Sulfate (IRON PO), Take 1 tablet by mouth daily., Disp: , Rfl:    levocetirizine (XYZAL) 5 MG tablet, Take 1 tablet by mouth daily., Disp: , Rfl: 3   losartan  (COZAAR ) 100 MG tablet, Take 1 tablet (100 mg total) by mouth daily., Disp: 90 tablet, Rfl: 1   montelukast (SINGULAIR) 10 MG tablet, Take 10 mg by mouth at bedtime., Disp: , Rfl:    moxifloxacin (VIGAMOX) 0.5 % ophthalmic solution, Place into the right eye 3 (three) times daily., Disp: , Rfl:    Multiple Vitamin (MULITIVITAMIN WITH MINERALS) TABS, Take 3 tablets by mouth daily. Adult gummy multi-vitamins, Disp: , Rfl:    omeprazole  (PRILOSEC) 20 MG capsule, Take 20 mg by mouth daily., Disp: , Rfl:    Probiotic Product (PROBIOTIC DAILY PO), Take 1 capsule daily by mouth., Disp: , Rfl:    rosuvastatin  (CRESTOR ) 20 MG tablet, TAKE 1 TABLET(20 MG) BY MOUTH DAILY, Disp: 90 tablet, Rfl: 0   traZODone  (DESYREL ) 50 MG tablet, Take 1 tablet (50 mg total) by mouth at bedtime as needed for sleep., Disp: 30 tablet, Rfl: 0   tretinoin (RETIN-A) 0.1 %  cream, SMARTSIG:Topical Every Evening, Disp: , Rfl:    VITAMIN D PO, Take 1 tablet by mouth daily., Disp: , Rfl:   Social History   Tobacco Use  Smoking Status Never  Smokeless Tobacco Never    No Known Allergies Objective:  There were no vitals filed for this visit. There is no height or weight on file to calculate BMI. Constitutional Well developed. Well nourished.  Vascular Dorsalis pedis pulses palpable bilaterally. Posterior tibial pulses palpable bilaterally. Capillary refill normal to all digits.  No cyanosis or clubbing noted. Pedal hair growth normal.  Neurologic Normal speech. Oriented to person, place, and time. Epicritic sensation to light touch grossly present bilaterally.  Dermatologic Nails well groomed and normal in appearance. No open wounds. No skin lesions.  Orthopedic: Normal  joint ROM without pain or crepitus bilaterally. No visible deformities. Tender to palpation at the calcaneal tuber right. No pain with calcaneal squeeze right. Ankle ROM diminished range of motion right. Silfverskiold Test: positive right.   Radiographs: None  Assessment:   1. Plantar fasciitis of right foot   2. Gastrocnemius equinus of right lower extremity    Plan:  Patient was evaluated and treated and all questions answered.  Plantar Fasciitis, right with underlying gastrocnemius equinus - XR reviewed as above.  - Educated on icing and stretching. Instructions given.  - Injection delivered to the plantar fascia as below. - DME: Plantar fascial brace dispensed to support the medial longitudinal arch of the foot and offload pressure from the heel and prevent arch collapse during weightbearing - Pharmacologic management: None  Procedure: Injection Tendon/Ligament Location: Right plantar fascia at the glabrous junction; medial approach. Skin Prep: alcohol  Injectate: 0.5 cc 0.5% marcaine plain, 0.5 cc of 1% Lidocaine, 0.5 cc kenalog 10. Disposition: Patient tolerated procedure well. Injection site dressed with a band-aid.  Return in about 4 weeks (around 05/03/2024).

## 2024-04-06 ENCOUNTER — Telehealth: Payer: Self-pay | Admitting: Internal Medicine

## 2024-04-06 NOTE — Telephone Encounter (Signed)
 1. Which medications need to be refilled? (please list name of each medication and dose if known)   losartan  (COZAAR ) 100 MG tablet       2. Would you like to learn more about the convenience, safety, & potential cost savings by using the [facility]? no     3. Are you open to using the [facility] (Type [facility]. no     4. Which pharmacy/location (including street and city if local pharmacy) is medication to be sent to?   [id number] - [place], [place] - [address] AT [facility] OF [address]    5. Do they need a 30 day or 90 day supply? 90 days

## 2024-04-07 MED ORDER — LOSARTAN POTASSIUM 100 MG PO TABS: 100.0000 mg | ORAL_TABLET | Freq: Every day | ORAL | 1 refills | Status: AC

## 2024-04-07 NOTE — Telephone Encounter (Signed)
 Pt scheduled to see Dr. Loni 06/15/24.  Refill sent.

## 2024-04-11 ENCOUNTER — Telehealth: Payer: Self-pay | Admitting: Lab

## 2024-04-11 NOTE — Telephone Encounter (Signed)
 Patient has some follow up orthopedic shoes would she benefit from them and what brand ?

## 2024-04-24 ENCOUNTER — Telehealth: Payer: Self-pay

## 2024-04-24 NOTE — Telephone Encounter (Signed)
 Patient called today requesting a list of shoes for plantar fasciitis. I went over the provided list with the patient. Patient states she would like a list of specific shoes per brand.

## 2024-04-24 NOTE — Telephone Encounter (Signed)
 Patient called requesting a specific list of shoes per recommended brands that can worn for planta fasciitis. I reviewed the brand list provided by Dr. Tobie on 04/01/2024 with patient.

## 2024-05-03 ENCOUNTER — Ambulatory Visit: Admitting: Podiatry

## 2024-05-03 DIAGNOSIS — M722 Plantar fascial fibromatosis: Secondary | ICD-10-CM | POA: Diagnosis not present

## 2024-05-03 MED ORDER — METHYLPREDNISOLONE 4 MG PO TBPK: ORAL_TABLET | ORAL | 0 refills | Status: AC

## 2024-05-03 MED ORDER — MELOXICAM 15 MG PO TABS: 15.0000 mg | ORAL_TABLET | Freq: Every day | ORAL | 0 refills | Status: DC

## 2024-05-03 NOTE — Progress Notes (Signed)
 Subjective:  Patient ID: Faith [name], female    DOB: 1956-04-09,  MRN: [id number]  Chief Complaint  Patient presents with   Plantar Fasciitis    Pt stated that she is still having a lot of pain she stated that it feels worse than it was she stated the injection did not help     68 y.o. female presents with the above complaint.  Patient presents with complaint of right plantar fasciitis she states that is about the same the injection did not help if anything her pain has gotten worse denies any other acute complaints.   Review of Systems: Negative except as noted in the HPI. Denies N/V/F/Ch.  Past Medical History:  Diagnosis Date   Allergy    spring    Anemia    yrs ago    Arthritis    Family history of colon cancer in father    Gastric ulcer    GERD (gastroesophageal reflux disease)    Hyperlipidemia    Hypertension    MVP (mitral valve prolapse)    Nerve damage 2023   Osteopenia    Osteoporosis     Current Outpatient Medications:    meloxicam  (MOBIC ) 15 MG tablet, Take 1 tablet (15 mg total) by mouth daily., Disp: 30 tablet, Rfl: 0   methylPREDNISolone  (MEDROL  DOSEPAK) 4 MG TBPK tablet, Take as directed, Disp: 21 each, Rfl: 0   AMBULATORY NON FORMULARY MEDICATION, Probioslim Extra Strength Probiotic, fat burner and bloat Tae 2 tablet by mouth daily, Disp: , Rfl:    amLODipine  (NORVASC ) 5 MG tablet, TAKE 1 TABLET(5 MG) BY MOUTH DAILY, Disp: 90 tablet, Rfl: 0   amLODipine -atorvastatin (CADUET) 5-10 MG tablet, daily., Disp: , Rfl:    amoxicillin-clavulanate (AUGMENTIN) 875-125 MG tablet, 2 (two) times daily., Disp: , Rfl:    AUVI-Q 0.3 MG/0.3ML SOAJ injection, Inject 0.3 mg into the muscle as needed., Disp: , Rfl:    azelastine (ASTELIN) 0.1 % nasal spray, , Disp: , Rfl:    B Complex Vitamins (VITAMIN B COMPLEX PO), Take 1 tablet by mouth daily., Disp: , Rfl:    CALCIUM  PO, Take 1 tablet by mouth daily. Adult chewable calcium  supplement, Disp: , Rfl:    denosumab   (PROLIA ) 60 MG/ML SOSY injection, as directed. Once per year, Disp: , Rfl:    dicyclomine  (BENTYL ) 20 MG tablet, Take 20 mg by mouth in the morning and at bedtime., Disp: , Rfl:    DULoxetine  (CYMBALTA ) 60 MG capsule, Take 1 capsule (60 mg total) by mouth daily., Disp: 30 capsule, Rfl: 5   Ferrous Sulfate (IRON PO), Take 1 tablet by mouth daily., Disp: , Rfl:    levocetirizine (XYZAL) 5 MG tablet, Take 1 tablet by mouth daily., Disp: , Rfl: 3   losartan  (COZAAR ) 100 MG tablet, Take 1 tablet (100 mg total) by mouth daily., Disp: 90 tablet, Rfl: 1   montelukast (SINGULAIR) 10 MG tablet, Take 10 mg by mouth at bedtime., Disp: , Rfl:    moxifloxacin (VIGAMOX) 0.5 % ophthalmic solution, Place into the right eye 3 (three) times daily., Disp: , Rfl:    Multiple Vitamin (MULITIVITAMIN WITH MINERALS) TABS, Take 3 tablets by mouth daily. Adult gummy multi-vitamins, Disp: , Rfl:    omeprazole  (PRILOSEC) 20 MG capsule, Take 20 mg by mouth daily., Disp: , Rfl:    Probiotic Product (PROBIOTIC DAILY PO), Take 1 capsule daily by mouth., Disp: , Rfl:    rosuvastatin  (CRESTOR ) 20 MG tablet, TAKE 1 TABLET(20 MG) BY MOUTH  DAILY, Disp: 90 tablet, Rfl: 0   traZODone  (DESYREL ) 50 MG tablet, Take 1 tablet (50 mg total) by mouth at bedtime as needed for sleep., Disp: 30 tablet, Rfl: 0   tretinoin (RETIN-A) 0.1 % cream, SMARTSIG:Topical Every Evening, Disp: , Rfl:    VITAMIN D PO, Take 1 tablet by mouth daily., Disp: , Rfl:   Social History   Tobacco Use  Smoking Status Never  Smokeless Tobacco Never    Allergies  Allergen Reactions   Ibandronate      Other Reaction(s): GI ulcer  Other Reaction(s): Not available   Molds & Smuts     Other Reaction(s): Unknown   Objective:  There were no vitals filed for this visit. There is no height or weight on file to calculate BMI. Constitutional Well developed. Well nourished.  Vascular Dorsalis pedis pulses palpable bilaterally. Posterior tibial pulses palpable  bilaterally. Capillary refill normal to all digits.  No cyanosis or clubbing noted. Pedal hair growth normal.  Neurologic Normal speech. Oriented to person, place, and time. Epicritic sensation to light touch grossly present bilaterally.  Dermatologic Nails well groomed and normal in appearance. No open wounds. No skin lesions.  Orthopedic: Normal joint ROM without pain or crepitus bilaterally. No visible deformities. Tender to palpation at the calcaneal tuber right. No pain with calcaneal squeeze right. Ankle ROM diminished range of motion right. Silfverskiold Test: positive right.   Radiographs: None  Assessment:   1. Plantar fasciitis of right foot     Plan:  Patient was evaluated and treated and all questions answered.  Plantar Fasciitis, right with underlying gastrocnemius equinus - XR reviewed as above.  - Educated on icing and stretching. Instructions given.  - No further injection as they have not helped. - DME: Cam boot and night splint.  Clopidogrel dispensed. - Pharmacologic management: Medrol  Dosepak and meloxicam  - If there is no improvement we discussed physical therapy versus PRP injection  No follow-ups on file.

## 2024-05-20 ENCOUNTER — Telehealth: Payer: Self-pay | Admitting: Podiatry

## 2024-05-20 NOTE — Telephone Encounter (Signed)
 Patient called answering service with c/o severe right foot pain.  Attempted to call back approximately 1:20 PM, left a voicemail

## 2024-05-28 ENCOUNTER — Other Ambulatory Visit: Payer: Self-pay | Admitting: Podiatry

## 2024-05-31 ENCOUNTER — Ambulatory Visit: Admitting: Podiatry

## 2024-05-31 DIAGNOSIS — M722 Plantar fascial fibromatosis: Secondary | ICD-10-CM | POA: Diagnosis not present

## 2024-05-31 NOTE — Progress Notes (Signed)
 "  Subjective:  Patient ID: Faith [name], female    DOB: 28-Feb-1956,  MRN: [id number]  Chief Complaint  Patient presents with   Plantar Fasciitis    69 y.o. female presents with the above complaint.  Patient presents with complaint of right plantar fasciitis she states cam boot immobilization did not really help manage denies any other acute complaints would like to discuss next treatment plan.  Review of Systems: Negative except as noted in the HPI. Denies N/V/F/Ch.  Past Medical History:  Diagnosis Date   Allergy    spring    Anemia    yrs ago    Arthritis    Family history of colon cancer in father    Gastric ulcer    GERD (gastroesophageal reflux disease)    Hyperlipidemia    Hypertension    MVP (mitral valve prolapse)    Nerve damage 2023   Osteopenia    Osteoporosis     Current Outpatient Medications:    AMBULATORY NON FORMULARY MEDICATION, Probioslim Extra Strength Probiotic, fat burner and bloat Tae 2 tablet by mouth daily, Disp: , Rfl:    amLODipine  (NORVASC ) 5 MG tablet, TAKE 1 TABLET(5 MG) BY MOUTH DAILY, Disp: 90 tablet, Rfl: 0   amLODipine -atorvastatin (CADUET) 5-10 MG tablet, daily., Disp: , Rfl:    amoxicillin-clavulanate (AUGMENTIN) 875-125 MG tablet, 2 (two) times daily., Disp: , Rfl:    AUVI-Q 0.3 MG/0.3ML SOAJ injection, Inject 0.3 mg into the muscle as needed., Disp: , Rfl:    azelastine (ASTELIN) 0.1 % nasal spray, , Disp: , Rfl:    B Complex Vitamins (VITAMIN B COMPLEX PO), Take 1 tablet by mouth daily., Disp: , Rfl:    CALCIUM  PO, Take 1 tablet by mouth daily. Adult chewable calcium  supplement, Disp: , Rfl:    denosumab  (PROLIA ) 60 MG/ML SOSY injection, as directed. Once per year, Disp: , Rfl:    dicyclomine  (BENTYL ) 20 MG tablet, Take 20 mg by mouth in the morning and at bedtime., Disp: , Rfl:    DULoxetine  (CYMBALTA ) 60 MG capsule, Take 1 capsule (60 mg total) by mouth daily., Disp: 30 capsule, Rfl: 5   Ferrous Sulfate (IRON PO), Take 1 tablet by  mouth daily., Disp: , Rfl:    levocetirizine (XYZAL) 5 MG tablet, Take 1 tablet by mouth daily., Disp: , Rfl: 3   losartan  (COZAAR ) 100 MG tablet, Take 1 tablet (100 mg total) by mouth daily., Disp: 90 tablet, Rfl: 1   meloxicam  (MOBIC ) 15 MG tablet, TAKE 1 TABLET(15 MG) BY MOUTH DAILY, Disp: 30 tablet, Rfl: 0   methylPREDNISolone  (MEDROL  DOSEPAK) 4 MG TBPK tablet, Take as directed, Disp: 21 each, Rfl: 0   montelukast (SINGULAIR) 10 MG tablet, Take 10 mg by mouth at bedtime., Disp: , Rfl:    moxifloxacin (VIGAMOX) 0.5 % ophthalmic solution, Place into the right eye 3 (three) times daily., Disp: , Rfl:    Multiple Vitamin (MULITIVITAMIN WITH MINERALS) TABS, Take 3 tablets by mouth daily. Adult gummy multi-vitamins, Disp: , Rfl:    omeprazole  (PRILOSEC) 20 MG capsule, Take 20 mg by mouth daily., Disp: , Rfl:    Probiotic Product (PROBIOTIC DAILY PO), Take 1 capsule daily by mouth., Disp: , Rfl:    rosuvastatin  (CRESTOR ) 20 MG tablet, TAKE 1 TABLET(20 MG) BY MOUTH DAILY, Disp: 90 tablet, Rfl: 0   traZODone  (DESYREL ) 50 MG tablet, Take 1 tablet (50 mg total) by mouth at bedtime as needed for sleep., Disp: 30 tablet, Rfl: 0  tretinoin (RETIN-A) 0.1 % cream, SMARTSIG:Topical Every Evening, Disp: , Rfl:    VITAMIN D PO, Take 1 tablet by mouth daily., Disp: , Rfl:   Social History   Tobacco Use  Smoking Status Never  Smokeless Tobacco Never    Allergies  Allergen Reactions   Ibandronate      Other Reaction(s): GI ulcer  Other Reaction(s): Not available   Molds & Smuts     Other Reaction(s): Unknown   Objective:  There were no vitals filed for this visit. There is no height or weight on file to calculate BMI. Constitutional Well developed. Well nourished.  Vascular Dorsalis pedis pulses palpable bilaterally. Posterior tibial pulses palpable bilaterally. Capillary refill normal to all digits.  No cyanosis or clubbing noted. Pedal hair growth normal.  Neurologic Normal speech. Oriented  to person, place, and time. Epicritic sensation to light touch grossly present bilaterally.  Dermatologic Nails well groomed and normal in appearance. No open wounds. No skin lesions.  Orthopedic: Normal joint ROM without pain or crepitus bilaterally. No visible deformities. Tender to palpation at the calcaneal tuber right. No pain with calcaneal squeeze right. Ankle ROM diminished range of motion right. Silfverskiold Test: positive right.   Radiographs: None  Assessment:   1. Plantar fasciitis of right foot     Plan:  Patient was evaluated and treated and all questions answered.  Plantar Fasciitis, right with underlying gastrocnemius equinus - XR reviewed as above.  - Educated on icing and stretching. Instructions given.  - No further injection as they have not helped. - DME: Continue cam boot as needed - Pharmacologic management: Medrol  Dosepak and meloxicam  -Clinically patient continues to do with chronic plantar fasciitis therefore I believe she would benefit from physical therapy as well as PRP injection.  She will be scheduled for both.  I would like for her to do 6 weeks of physical therapy she states understanding.  Will also do simultaneously PRP injection as well.  No follow-ups on file.   "

## 2024-06-14 ENCOUNTER — Ambulatory Visit (INDEPENDENT_AMBULATORY_CARE_PROVIDER_SITE_OTHER): Payer: Self-pay | Admitting: Podiatry

## 2024-06-14 DIAGNOSIS — M722 Plantar fascial fibromatosis: Secondary | ICD-10-CM

## 2024-06-14 NOTE — Progress Notes (Signed)
 5 cc of PRP injection given no complication noted.  She can resume regular activities tomorrow.  I discussed with her to avoid anti-inflammatories she states understanding

## 2024-06-15 ENCOUNTER — Telehealth: Payer: Self-pay | Admitting: *Deleted

## 2024-06-15 ENCOUNTER — Ambulatory Visit: Payer: Self-pay | Admitting: Internal Medicine

## 2024-06-15 VITALS — BP 142/84 | HR 51 | Ht 63.0 in | Wt 182.6 lb

## 2024-06-15 DIAGNOSIS — I2510 Atherosclerotic heart disease of native coronary artery without angina pectoris: Secondary | ICD-10-CM | POA: Diagnosis present

## 2024-06-15 DIAGNOSIS — R0789 Other chest pain: Secondary | ICD-10-CM | POA: Diagnosis present

## 2024-06-15 DIAGNOSIS — I10 Essential (primary) hypertension: Secondary | ICD-10-CM | POA: Diagnosis present

## 2024-06-15 DIAGNOSIS — E782 Mixed hyperlipidemia: Secondary | ICD-10-CM | POA: Diagnosis present

## 2024-06-15 MED ORDER — AMLODIPINE BESYLATE 2.5 MG PO TABS: 2.5000 mg | ORAL_TABLET | Freq: Every day | ORAL | 3 refills | Status: AC

## 2024-06-15 NOTE — Progress Notes (Signed)
 " Cardiology Office Note:  .   Date:  06/15/2024  ID:  Faith [name], DOB [birth year], MRN [id number] PCP: Stephane Leita DEL, MD  [facility] Providers Cardiologist:  Soyla DELENA Merck, MD    History of Present Illness: Faith   Yula A [name] is a 69 y.o. female.  Discussed the use of AI scribe software for clinical note transcription with the patient, who gave verbal consent to proceed.  History of Present Illness Faith [name] is a 69 year old female with hypertension and mild coronary artery disease who presents for a cardiovascular follow-up.  She has had no chest pain since her last follow-up in 2024. Her last significant chest pain was about several months ago and prompted an increase in blood pressure medication. She attributes recent intermittent blood pressure elevations to weight gain and pain from a plantar fasciitis procedure.  Hyperlipidemia is controlled on rosuvastatin  20 mg daily. Her LDL was 45 in March 2025. Hypertension is treated with losartan  100 mg daily and amlodipine  2.5 mg daily. She reports confusion about her prescriptions and wants to confirm [facility] has her current cardiovascular medications. [facility] will call [facility] and also call the patient at home today to verify med bottles. I reviewed last PCP visit for med rec.   Mild coronary artery disease was seen on coronary CT in February 2021 with mild mid LAD disease and a coronary artery calcium  score of 19.  She has plantar fasciitis that limits exercise and has contributed to about a 20-pound weight gain since her last cardiology visit. She is not using meloxicam  after a recent PRP injection.     ROS: negative except per HPI above.  Studies Reviewed: Faith   EKG Interpretation Date/Time:  Thursday June 15 2024 09:33:38 EST Ventricular Rate:  51 PR Interval:  124 QRS Duration:  86 QT Interval:  478 QTC Calculation: 440 R Axis:   33  Text Interpretation: Sinus bradycardia Poor R wave  progression Confirmed by Merck Soyla ([id number]) on 06/15/2024 9:43:17 AM    Results Labs LDL (07/2023): 45  Radiology Coronary CT angiography (06/2019): Mild coronary artery disease in mid left anterior descending artery; otherwise normal coronary arteries Coronary artery calcium  score (06/2019): 19 Risk Assessment/Calculations:       Physical Exam:   VS:  BP (!) 142/84 (BP Location: Right Arm, Patient Position: Sitting, Cuff Size: Large)   Pulse (!) 51   Ht 5' 3 (1.6 m)   Wt 182 lb 9.6 oz (82.8 kg)   SpO2 98%   BMI 32.35 kg/m    Wt Readings from Last 3 Encounters:  06/15/24 182 lb 9.6 oz (82.8 kg)  09/23/22 163 lb (73.9 kg)  08/13/22 163 lb 9.6 oz (74.2 kg)     Physical Exam GENERAL: Alert, cooperative, well developed, no acute distress HEENT: Normocephalic, normal oropharynx, moist mucous membranes CHEST: Clear to auscultation bilaterally, no wheezes, rhonchi, or crackles CARDIOVASCULAR: Normal heart rate and rhythm, S1 and S2 normal without murmurs ABDOMEN: Soft, non-tender, non-distended, without organomegaly, normal bowel sounds EXTREMITIES: No cyanosis or edema NEUROLOGICAL: Cranial nerves grossly intact, moves all extremities without gross motor or sensory deficit   ASSESSMENT AND PLAN: .    Assessment and Plan Assessment & Plan Primary hypertension Medication management.  Hypertension controlled with losartan  and amlodipine . Recent blood pressure elevation likely due to weight gain and limited exercise. - Continue losartan  100 mg daily. - Continue amlodipine  2.5 mg daily. - Ensure [facility] has accurate medication list and  confirm current scripts with patient.   Coronary artery disease Mild disease in mid LAD with calcium  score of 19. No recent chest pain. Blood pressure control essential. - Continue current management and monitoring. Continue rosuvastatin  20 mg daily.   Hyperlipidemia Controlled with rosuvastatin . LDL was 45 in March 2025. - Continue  rosuvastatin  20 mg daily. - Ensure [facility] has accurate medication list.  Obesity Recent 20-pound weight gain. Discussed GLP-1 agonists for weight loss, considering past pancreatitis. Referral to pharmacist for detailed discussion. - Referred to pharmacist for discussion on GLP-1 agonists for weight loss. - Encouraged weight loss through diet and exercise as tolerated.        Soyla Merck, MD, [facility] "

## 2024-06-15 NOTE — Patient Instructions (Addendum)
 Medication Instructions:  Decrease: Amlodipine  to 2.5 mg, one tablet, once daily (your new prescription will be ready on 06/16/2024)  Lab Work: None  Follow-Up: At [facility], you and your health needs are our priority.  As part of our continuing mission to provide you with exceptional heart care, our providers are all part of one team.  This team includes your primary Cardiologist (physician) and Advanced Practice Providers or APPs (Physician Assistants and Nurse Practitioners) who all work together to provide you with the care you need, when you need it.  Your next appointment:   1 year(s) (We will mail a reminder letter around November 2026; please call for a January 2027 appointment)  Provider:   Gayatri A Acharya, MD or Hao Meng, PA, or Callie Goodrich, [place], or Damien Braver, NP, or Rollo Louder, [place].    Other Instructions We have referred you to our Pharmacy Team so that you can meet with them and discuss possible GLP-1 medications. You can make an appointment at the Check out desk.   Please call us  or send a MyChart message with any Cardiology related questions/concerns.  [phone].  Thank you!

## 2024-06-23 ENCOUNTER — Other Ambulatory Visit (HOSPITAL_BASED_OUTPATIENT_CLINIC_OR_DEPARTMENT_OTHER): Payer: Self-pay | Admitting: Internal Medicine

## 2024-06-23 DIAGNOSIS — Z1231 Encounter for screening mammogram for malignant neoplasm of breast: Secondary | ICD-10-CM

## 2024-07-14 ENCOUNTER — Ambulatory Visit: Admitting: Podiatry

## 2024-07-18 ENCOUNTER — Ambulatory Visit (HOSPITAL_BASED_OUTPATIENT_CLINIC_OR_DEPARTMENT_OTHER): Admitting: Radiology

## 2024-07-24 ENCOUNTER — Ambulatory Visit: Admitting: Pharmacist
